# Patient Record
Sex: Male | Born: 1940
Health system: Southern US, Community
[De-identification: ages and names within clinical notes are randomized; demographics above are authoritative.]

## PROBLEM LIST (undated history)

## (undated) DIAGNOSIS — K2950 Unspecified chronic gastritis without bleeding: Secondary | ICD-10-CM

## (undated) DIAGNOSIS — C884 Extranodal marginal zone B-cell lymphoma of mucosa-associated lymphoid tissue [MALT-lymphoma]: Secondary | ICD-10-CM

## (undated) DIAGNOSIS — D649 Anemia, unspecified: Secondary | ICD-10-CM

## (undated) DIAGNOSIS — D509 Iron deficiency anemia, unspecified: Secondary | ICD-10-CM

## (undated) DIAGNOSIS — F17200 Nicotine dependence, unspecified, uncomplicated: Secondary | ICD-10-CM

## (undated) DIAGNOSIS — B9681 Helicobacter pylori [H. pylori] as the cause of diseases classified elsewhere: Secondary | ICD-10-CM

## (undated) DIAGNOSIS — C9310 Chronic myelomonocytic leukemia not having achieved remission: Secondary | ICD-10-CM

## (undated) DIAGNOSIS — D126 Benign neoplasm of colon, unspecified: Secondary | ICD-10-CM

## (undated) HISTORY — DX: Benign neoplasm of colon, unspecified: D12.6

## (undated) HISTORY — DX: Nicotine dependence, unspecified, uncomplicated: F17.200

## (undated) HISTORY — DX: Unspecified chronic gastritis without bleeding: K29.50

## (undated) HISTORY — PX: ESOPHAGOGASTRODUODENOSCOPY ENDOSCOPY: SHX5814

## (undated) HISTORY — DX: Iron deficiency anemia, unspecified: D50.9

## (undated) HISTORY — DX: Helicobacter pylori (H. pylori) as the cause of diseases classified elsewhere: B96.81

## (undated) HISTORY — PX: OTHER SURGICAL HISTORY: SHX169

## (undated) HISTORY — DX: Extranodal marginal zone B-cell lymphoma of mucosa-associated lymphoid tissue (MALT-lymphoma): C88.4

## (undated) HISTORY — DX: Chronic myelomonocytic leukemia not having achieved remission: C93.10

---

## 2011-08-01 ENCOUNTER — Encounter: Payer: Self-pay | Admitting: Family Medicine

## 2011-08-01 ENCOUNTER — Ambulatory Visit (INDEPENDENT_AMBULATORY_CARE_PROVIDER_SITE_OTHER): Payer: Medicare Other | Admitting: Family Medicine

## 2011-08-01 DIAGNOSIS — N433 Hydrocele, unspecified: Secondary | ICD-10-CM

## 2011-08-01 NOTE — Progress Notes (Signed)
  Subjective:    Patient ID: Jonathan Green, male    DOB: 1941/02/24, 70 y.o.   MRN: [id number]  HPI He is here for evaluation of swelling in his scrotum. He states that approximately 8 years ago he was diagnosed with hydrocele and had been seen by urology at that time. He chose to do nothing about it. He notes that the swelling is getting worse and causing more mechanical discomfort.   Review of Systems     Objective:   Physical Exam Exam of the right testes normal. Left testes could not be palpated. Transillumination was positive      Assessment & Plan:   1. Hydrocele, left  Ambulatory referral to Urology

## 2011-08-30 ENCOUNTER — Other Ambulatory Visit: Payer: Self-pay | Admitting: Urology

## 2011-09-20 ENCOUNTER — Encounter (HOSPITAL_BASED_OUTPATIENT_CLINIC_OR_DEPARTMENT_OTHER): Payer: Self-pay | Admitting: *Deleted

## 2011-09-20 NOTE — Progress Notes (Signed)
To [facility] at 0730.NPO after mn.Hg,Ekg on arrival.

## 2011-09-21 NOTE — H&P (Signed)
History of Present Illness     Mr. Jonathan Green is a 71 year old male patient of Dr. Susann Givens seen in office consultation today for further evaluation of a left hydrocele. The patient reports he was diagnosed with a hydrocele by a urologist about 8 years ago and elected to observe it. Over time it has increased in size.  it has become mechanically uncomfortable. It was found to transilluminate consistent with a hydrocele. He reports he has no voiding symptoms. His hydrocele would be of moderate severity with no modifying factors or associated signs and symptoms.   Past Medical History Problems  1. History of  No Medical Problems  Surgical History Problems  1. History of  No Surgical Problems  Current Meds 1. Advil 200 MG Oral Capsule; Therapy: (Recorded:13Dec2012) to  Allergies Medication  1. No Known Drug Allergies  Family History Problems  1. Family history of  No Significant Family History  Social History Problems    Caffeine Use   Marital History - Currently Married   Tobacco Use 305.1 Denied    History of  Alcohol Use  Review of Systems Genitourinary, constitutional, skin, eye, otolaryngeal, hematologic/lymphatic, cardiovascular, pulmonary, endocrine, musculoskeletal, gastrointestinal, neurological and psychiatric system(s) were reviewed and pertinent findings if present are noted.  Genitourinary: urinary frequency, feelings of urinary urgency, nocturia and scrotal swelling.  Musculoskeletal: back pain.    Vitals Vital Signs  BMI Calculated: 28.04 BSA Calculated: 1.98 Height: 5 ft 8 in Weight: 185 lb  Blood Pressure: 123 / 66 Heart Rate: 59  Physical Exam Constitutional: Well nourished and well developed . No acute distress.  ENT:. The ears and nose are normal in appearance.  Neck: The appearance of the neck is normal and no neck mass is present.  Pulmonary: No respiratory distress and normal respiratory rhythm and effort.  Cardiovascular: Heart rate and rhythm  are normal . No peripheral edema.  Abdomen: The abdomen is soft and nontender. No masses are palpated. No CVA tenderness. No hernias are palpable. No hepatosplenomegaly noted.  Genitourinary: Examination of the penis demonstrates no discharge, no masses, no lesions and a normal meatus. The scrotum is without lesions. Examination of the left scrotum demostrates a hydrocele. The right epididymis is palpably normal and non-tender. The left epididymis is palpably normal and non-tender. The right testis is non-tender and without masses. The left testis is non-tender and without masses.  Lymphatics: The femoral and inguinal nodes are not enlarged or tender.  Skin: Normal skin turgor, no visible rash and no visible skin lesions.  Neuro/Psych:. Mood and affect are appropriate.    Results/Data Urine  COLOR: YELLOW  Reference Range YELLOW APPEARANCE: CLEAR  Reference Range CLEAR SPECIFIC GRAVITY: 1.025  Reference Range 1.005-1.030 pH: 5.5  Reference Range 5.0-8.0 GLUCOSE: NEG mg/dL Reference Range NEG BILIRUBIN: NEG  Reference Range NEG KETONE: NEG mg/dL Reference Range NEG BLOOD: MOD  Abnormal Reference Range NEG PROTEIN: NEG mg/dL Reference Range NEG UROBILINOGEN: 0.2 mg/dL Reference Range 1.6-1.0 NITRITE: NEG  Reference Range NEG LEUKOCYTE ESTERASE: NEG  Reference Range NEG SQUAMOUS EPITHELIAL/HPF: NONE SEEN  Reference Range RARE WBC: NONE SEEN WBC/hpf Reference Range <4 RBC: 0-3 RBC/hpf Reference Range <4 BACTERIA: NONE SEEN  Reference Range RARE CRYSTALS: NONE SEEN  Reference Range NEG CASTS: NONE SEEN  Reference Range NEG  Old records or history reviewed: notes from Dr. Susann Givens office as above.    Assessment Assessed  1. Hydrocele Left 603.9    He appears to have a simple left hydrocele that has been increasing  in size and has become increasingly symptomatic. We therefore discussed the treatment options. He does not want to continue observation and therefore we discussed hydrocelectomy.  I went over the procedure with him in detail including its risks, complications, incision used and anticipated postoperative recovery as well as probability of success with surgery. He understands and has elected to proceed with hydrocelectomy.   Plan Health Maintenance (V70.0)  1. UA With REFLEX  Done: 13Dec2012 07:57AM Hydrocele Left (603.9)  2. Follow-up Schedule Surgery Office  Follow-up  Done: 13Dec2012       He will be scheduled for a left hydrocelectomy

## 2011-09-22 ENCOUNTER — Encounter (HOSPITAL_BASED_OUTPATIENT_CLINIC_OR_DEPARTMENT_OTHER): Payer: Self-pay | Admitting: *Deleted

## 2011-09-22 ENCOUNTER — Other Ambulatory Visit: Payer: Self-pay

## 2011-09-22 ENCOUNTER — Ambulatory Visit (HOSPITAL_BASED_OUTPATIENT_CLINIC_OR_DEPARTMENT_OTHER): Payer: Medicare Other | Admitting: Anesthesiology

## 2011-09-22 ENCOUNTER — Encounter (HOSPITAL_BASED_OUTPATIENT_CLINIC_OR_DEPARTMENT_OTHER): Payer: Self-pay | Admitting: Anesthesiology

## 2011-09-22 ENCOUNTER — Encounter (HOSPITAL_BASED_OUTPATIENT_CLINIC_OR_DEPARTMENT_OTHER)
Admission: RE | Disposition: A | Discharge status: Home / Self Care | Payer: Self-pay | Source: Ambulatory Visit | Attending: Urology

## 2011-09-22 ENCOUNTER — Ambulatory Visit (HOSPITAL_BASED_OUTPATIENT_CLINIC_OR_DEPARTMENT_OTHER)
Admission: RE | Admit: 2011-09-22 | Discharge: 2011-09-22 | Disposition: A | Discharge status: Home / Self Care | Payer: Medicare Other | Source: Ambulatory Visit | Attending: Urology | Admitting: Urology

## 2011-09-22 DIAGNOSIS — N433 Hydrocele, unspecified: Secondary | ICD-10-CM | POA: Insufficient documentation

## 2011-09-22 HISTORY — PX: HYDROCELE EXCISION: SHX482

## 2011-09-22 LAB — POCT HEMOGLOBIN-HEMACUE: Hemoglobin: 11.5 g/dL — ABNORMAL LOW (ref 13.0–17.0)

## 2011-09-22 SURGERY — HYDROCELECTOMY
Anesthesia: General | Site: Scrotum | Laterality: Left | Wound class: Clean

## 2011-09-22 MED ORDER — CEFAZOLIN SODIUM 1-5 GM-% IV SOLN: 1.0000 g | INTRAVENOUS | Status: DC

## 2011-09-22 MED ORDER — CEFAZOLIN SODIUM-DEXTROSE 2-3 GM-% IV SOLR: 2.0000 g | INTRAVENOUS | Status: DC

## 2011-09-22 MED ORDER — KETOROLAC TROMETHAMINE 30 MG/ML IJ SOLN
INTRAMUSCULAR | Status: DC | PRN
  Administered 2011-09-22: 30 mg via INTRAVENOUS

## 2011-09-22 MED ORDER — PROPOFOL 10 MG/ML IV EMUL
INTRAVENOUS | Status: DC | PRN
  Administered 2011-09-22: 180 mg via INTRAVENOUS

## 2011-09-22 MED ORDER — LACTATED RINGERS IV SOLN
INTRAVENOUS | Status: DC | PRN
  Administered 2011-09-22 (×2): via INTRAVENOUS

## 2011-09-22 MED ORDER — FENTANYL CITRATE 0.05 MG/ML IJ SOLN: 25.0000 ug | INTRAMUSCULAR | Status: DC | PRN

## 2011-09-22 MED ORDER — OXYCODONE-ACETAMINOPHEN 10-325 MG PO TABS: 1.0000 | ORAL_TABLET | ORAL | Status: AC | PRN

## 2011-09-22 MED ORDER — CEFAZOLIN SODIUM 1-5 GM-% IV SOLN
INTRAVENOUS | Status: DC | PRN
  Administered 2011-09-22: 2 g via INTRAVENOUS

## 2011-09-22 MED ORDER — PROMETHAZINE HCL 25 MG/ML IJ SOLN: 6.2500 mg | INTRAMUSCULAR | Status: DC | PRN

## 2011-09-22 MED ORDER — LACTATED RINGERS IV SOLN
INTRAVENOUS | Status: DC
  Administered 2011-09-22: 100 mL/h via INTRAVENOUS

## 2011-09-22 MED ORDER — LIDOCAINE HCL (CARDIAC) 20 MG/ML IV SOLN
INTRAVENOUS | Status: DC | PRN
  Administered 2011-09-22: 80 mg via INTRAVENOUS

## 2011-09-22 MED ORDER — FENTANYL CITRATE 0.05 MG/ML IJ SOLN
INTRAMUSCULAR | Status: DC | PRN
  Administered 2011-09-22 (×2): 50 ug via INTRAVENOUS

## 2011-09-22 MED ORDER — DEXAMETHASONE SODIUM PHOSPHATE 4 MG/ML IJ SOLN
INTRAMUSCULAR | Status: DC | PRN
  Administered 2011-09-22: 4 mg via INTRAVENOUS

## 2011-09-22 MED ORDER — 0.9 % SODIUM CHLORIDE (POUR BTL) OPTIME
TOPICAL | Status: DC | PRN
  Administered 2011-09-22: 500 mL

## 2011-09-22 MED ORDER — MIDAZOLAM HCL 5 MG/5ML IJ SOLN
INTRAMUSCULAR | Status: DC | PRN
  Administered 2011-09-22: 2 mg via INTRAVENOUS

## 2011-09-22 MED ORDER — BUPIVACAINE HCL (PF) 0.25 % IJ SOLN
INTRAMUSCULAR | Status: DC | PRN
  Administered 2011-09-22: 10 mL

## 2011-09-22 MED ORDER — ONDANSETRON HCL 4 MG/2ML IJ SOLN
INTRAMUSCULAR | Status: DC | PRN
  Administered 2011-09-22: 4 mg via INTRAVENOUS

## 2011-09-22 SURGICAL SUPPLY — 40 items
BANDAGE GAUZE ELAST BULKY 4 IN (GAUZE/BANDAGES/DRESSINGS) ×2 IMPLANT
BLADE SURG 15 STRL LF DISP TIS (BLADE) ×1 IMPLANT
BLADE SURG 15 STRL SS (BLADE) ×1
BLADE SURG ROTATE 9660 (MISCELLANEOUS) IMPLANT
CANISTER SUCTION 1200CC (MISCELLANEOUS) IMPLANT
CANISTER SUCTION 2500CC (MISCELLANEOUS) ×2 IMPLANT
CLEANER CAUTERY TIP 5X5 PAD (MISCELLANEOUS) IMPLANT
CLOTH BEACON ORANGE TIMEOUT ST (SAFETY) ×2 IMPLANT
COVER MAYO STAND STRL (DRAPES) ×2 IMPLANT
COVER TABLE BACK 60X90 (DRAPES) ×2 IMPLANT
DISSECTOR ROUND CHERRY 3/8 STR (MISCELLANEOUS) IMPLANT
DRAPE PED LAPAROTOMY (DRAPES) ×2 IMPLANT
ELECT REM PT RETURN 9FT ADLT (ELECTROSURGICAL)
ELECTRODE REM PT RTRN 9FT ADLT (ELECTROSURGICAL) IMPLANT
GAUZE KERLIX 2  STERILE LF (GAUZE/BANDAGES/DRESSINGS) ×2 IMPLANT
GLOVE BIO SURGEON STRL SZ 6.5 (GLOVE) ×2 IMPLANT
GLOVE BIO SURGEON STRL SZ8 (GLOVE) ×2 IMPLANT
GLOVE ECLIPSE 6.0 STRL STRAW (GLOVE) ×2 IMPLANT
GOWN PREVENTION PLUS LG XLONG (DISPOSABLE) ×2 IMPLANT
GOWN STRL REIN XL XLG (GOWN DISPOSABLE) ×2 IMPLANT
IV NS IRRIG 3000ML ARTHROMATIC (IV SOLUTION) IMPLANT
NEEDLE HYPO 25X1 1.5 SAFETY (NEEDLE) IMPLANT
NS IRRIG 500ML POUR BTL (IV SOLUTION) ×2 IMPLANT
PACK BASIN DAY SURGERY FS (CUSTOM PROCEDURE TRAY) ×2 IMPLANT
PAD CLEANER CAUTERY TIP 5X5 (MISCELLANEOUS)
PENCIL BUTTON HOLSTER BLD 10FT (ELECTRODE) ×2 IMPLANT
SUPPORT SCROTAL LG STRP (MISCELLANEOUS) ×2 IMPLANT
SUPPORT SCROTAL LRG NO STRP (SOFTGOODS) ×2 IMPLANT
SUT CHROMIC 3 0 SH 27 (SUTURE) ×6 IMPLANT
SUT SILK 4 0 TIES 17X18 (SUTURE) IMPLANT
SUT VIC AB 4-0 RB1 27 (SUTURE)
SUT VIC AB 4-0 RB1 27X BRD (SUTURE) IMPLANT
SUT VICRYL 6 0 RB 1 (SUTURE) IMPLANT
SYR CONTROL 10ML LL (SYRINGE) IMPLANT
TOWEL OR 17X24 6PK STRL BLUE (TOWEL DISPOSABLE) ×2 IMPLANT
TRAY DSU PREP LF (CUSTOM PROCEDURE TRAY) ×2 IMPLANT
TUBE CONNECTING 12X1/4 (SUCTIONS) ×2 IMPLANT
WATER STERILE IRR 3000ML UROMA (IV SOLUTION) IMPLANT
WATER STERILE IRR 500ML POUR (IV SOLUTION) ×2 IMPLANT
YANKAUER SUCT BULB TIP NO VENT (SUCTIONS) ×2 IMPLANT

## 2011-09-22 NOTE — Anesthesia Procedure Notes (Signed)
Procedure Name: LMA Insertion Date/Time: 09/22/2011 9:14 AM Performed by: Iline Oven Pre-anesthesia Checklist: Patient identified, Emergency Drugs available, Suction available and Patient being monitored Patient Re-evaluated:Patient Re-evaluated prior to inductionOxygen Delivery Method: Circle System Utilized Preoxygenation: Pre-oxygenation with 100% oxygen Intubation Type: IV induction Ventilation: Mask ventilation without difficulty LMA: LMA with gastric port inserted LMA Size: 4.0 Number of attempts: 1 Placement Confirmation: positive ETCO2 Tube secured with: Tape Dental Injury: Teeth and Oropharynx as per pre-operative assessment

## 2011-09-22 NOTE — Anesthesia Postprocedure Evaluation (Signed)
  Anesthesia Post-op Note  Patient: Jonathan Green  Procedure(s) Performed:  HYDROCELECTOMY ADULT  Patient Location: PACU  Anesthesia Type: General  Level of Consciousness: awake and alert   Airway and Oxygen Therapy: Patient Spontanous Breathing  Post-op Pain: mild  Post-op Assessment: Post-op Vital signs reviewed, Patient's Cardiovascular Status Stable, Respiratory Function Stable, Patent Airway and No signs of Nausea or vomiting  Post-op Vital Signs: stable  Complications: No apparent anesthesia complications

## 2011-09-22 NOTE — Op Note (Signed)
PATIENT:  Jonathan Green  PRE-OPERATIVE DIAGNOSIS:POST-OPERATIVE DIAGNOSIS:  Same  PROCEDURE:  Procedure(s): Left hydrocelectomy  SURGEON:  Garnett Farm  INDICATION: Jonathan Green is a 71 year old male patient who was originally diagnosed with a hydrocele 8 years ago. He elected for conservative management with observation however over time it has increased in size and has become mechanically uncomfortable. We discussed the treatment options and has elected to proceed with surgical therapy.  ANESTHESIA:  General  EBL:  Minimal  DRAINS: None  LOCAL MEDICATIONS USED:  1/4% plain Marcaine 10 cc  SPECIMEN:  None  DISPOSITION OF SPECIMEN:  N/A  Description of procedure: After informed consent the patient was brought to the major OR, placed on the table and administered general anesthesia. His genitalia was then sterilely prepped and draped. An official timeout was then performed.  A midline median raphae scrotal incision was then made and carried down over the left hydrocele. The tissue over the hydrocele was cleared using a combination of sharp and blunt technique. The hydrocele was then opened, drained of clear amber fluid and delivered through the incision. The excess hydrocele sac tissue was excised with the Bovie electrocautery and then I cauterized the edges. I then reapproximated the edges posteriorly behind the epididymis with a running, locking 3-0 chromic suture. The appendix testis was removed with the Bovie.   His testicle was then replaced in the normal anatomic position and his left hemiscrotum. I then closed the deep scrotal tissue with running 3-0 chromic suture in a locking fashion. I injected quarter percent Marcaine in the subcutaneous tissue and closed the skin with running 3-0 chromic. Neosporin, a sterile gauze dressing, fluff Kerlix and a scrotal support were applied. The patient tolerated the procedure well no intraoperative complications. Needle sponge and instrument  counts were correct at the end of the operation.   PLAN OF CARE: Discharge to home after PACU  PATIENT DISPOSITION:  PACU - hemodynamically stable.

## 2011-09-22 NOTE — Anesthesia Preprocedure Evaluation (Signed)
Anesthesia Evaluation  Patient identified by MRN, date of birth, ID band Patient awake    Reviewed: Allergy & Precautions, H&P , NPO status , Patient's Chart, lab work & pertinent test results  Airway Mallampati: II TM Distance: >3 FB Neck ROM: Full    Dental No notable dental hx.    Pulmonary neg pulmonary ROS, Current Smoker,  clear to auscultation  Pulmonary exam normal       Cardiovascular neg cardio ROS Regular Normal    Neuro/Psych Negative Neurological ROS  Negative Psych ROS   GI/Hepatic negative GI ROS, Neg liver ROS,   Endo/Other  Negative Endocrine ROS  Renal/GU negative Renal ROS  Genitourinary negative   Musculoskeletal negative musculoskeletal ROS (+)   Abdominal   Peds negative pediatric ROS (+)  Hematology negative hematology ROS (+)   Anesthesia Other Findings   Reproductive/Obstetrics negative OB ROS                           Anesthesia Physical Anesthesia Plan  ASA: II  Anesthesia Plan: General   Post-op Pain Management:    Induction: Intravenous  Airway Management Planned:   Additional Equipment:   Intra-op Plan:   Post-operative Plan: Extubation in OR  Informed Consent: I have reviewed the patients History and Physical, chart, labs and discussed the procedure including the risks, benefits and alternatives for the proposed anesthesia with the patient or authorized representative who has indicated his/her understanding and acceptance.   Dental advisory given  Plan Discussed with: CRNA  Anesthesia Plan Comments:         Anesthesia Quick Evaluation

## 2011-09-22 NOTE — Transfer of Care (Signed)
Immediate Anesthesia Transfer of Care Note  Patient: Jonathan Green  Procedure(s) Performed:  HYDROCELECTOMY ADULT  Patient Location: PACU  Anesthesia Type: General  Level of Consciousness: awake, sedated, patient cooperative and responds to stimulation  Airway & Oxygen Therapy: Patient Spontanous Breathing and Patient connected to face mask oxygen  Post-op Assessment: Report given to PACU RN, Post -op Vital signs reviewed and stable and Patient moving all extremities  Post vital signs: Reviewed and stable  Complications: No apparent anesthesia complications

## 2011-09-22 NOTE — Anesthesia Postprocedure Evaluation (Signed)
Immediate Anesthesia Transfer of Care Note  Patient: Jonathan Green  Procedure(s) Performed: * No surgery found *  Patient Location: PACU  Anesthesia Type: General  Level of Consciousness: awake, sedated, patient cooperative and responds to stimulation  Airway & Oxygen Therapy: Patient Spontanous Breathing and Patient connected to face mask oxygen  Post-op Assessment: Report given to PACU RN, Post -op Vital signs reviewed and stable and Patient moving all extremities  Post vital signs: Reviewed and stable  Complications: No apparent anesthesia complications

## 2011-09-22 NOTE — Transfer of Care (Deleted)
Immediate Anesthesia Transfer of Care Note  Patient: Jonathan Green  Procedure(s) Performed:  HYDROCELECTOMY ADULT  Patient Location: PACU  Anesthesia Type: General  Level of Consciousness: awake, sedated, patient cooperative and responds to stimulation  Airway & Oxygen Therapy: Patient Spontanous Breathing and Patient connected to face mask oxygen  Post-op Assessment: Report given to PACU RN, Post -op Vital signs reviewed and stable and Patient moving all extremities  Post vital signs: Reviewed and stable  Complications: No apparent anesthesia complications  

## 2011-09-25 ENCOUNTER — Encounter (HOSPITAL_BASED_OUTPATIENT_CLINIC_OR_DEPARTMENT_OTHER): Payer: Self-pay | Admitting: Urology

## 2012-02-28 ENCOUNTER — Encounter: Payer: Self-pay | Admitting: Family Medicine

## 2012-02-28 ENCOUNTER — Ambulatory Visit (INDEPENDENT_AMBULATORY_CARE_PROVIDER_SITE_OTHER): Payer: Medicare Other | Admitting: Family Medicine

## 2012-02-28 VITALS — BP 120/68 | HR 88 | Temp 98.3°F | Ht 68.0 in | Wt 183.0 lb

## 2012-02-28 DIAGNOSIS — J309 Allergic rhinitis, unspecified: Secondary | ICD-10-CM

## 2012-02-28 DIAGNOSIS — R05 Cough: Secondary | ICD-10-CM

## 2012-02-28 MED ORDER — HYDROCOD POLST-CHLORPHEN POLST 10-8 MG/5ML PO LQCR: 5.0000 mL | Freq: Every evening | ORAL | Status: DC | PRN

## 2012-02-28 NOTE — Patient Instructions (Addendum)
Cough--only finding on exam (and backed up some by the history) is that of allergies with postnasal drainage, contributing to the cough being worse at night.  Continue Allegra.  Add Mucinex D (or plain Mucinex and a decongestant such as pseudofed--either the one behind the counter at pharmacy, or the PE on shelf)  Use the Tussionex only at bedtime, causes sedation, which may last into the morning, as medication works for 12 hours. You can use Delsym syrup (cough suppressant) during the day, if needed, and the tussionex at night

## 2012-02-28 NOTE — Progress Notes (Signed)
Chief Complaint  Patient presents with  . Cough    sever cough x 2 days.   HPI:  Started sneezing about 3 days ago, thought related to dust, then 2 days ago started coughing, but got much worse last night.  Cough is dry, worse when lying down.  Wife states she could hear him "wheezing" from down the hall.  Had some chest pain after a big coughing spell.  Denies any relation to food or exertion.  Denies fevers or sick contacts.  Started taking Allegra yesterday, Nyquil x 3 days without any improvement.  History reviewed. No pertinent past medical history. Past Surgical History  Procedure Date  . Hydrocele excision 09/22/2011    Procedure: HYDROCELECTOMY ADULT;  Surgeon: Garnett Farm, MD;  Location[facility];  Service: Urology;  Laterality: Left;   History   Social History  . Marital Status: Married    Spouse Name: N/A    Number of Children: N/A  . Years of Education: N/A   Occupational History  . retired Conservation officer, historic buildings)    Social History Main Topics  . Smoking status: Current Some Day Smoker    Types: Cigars  . Smokeless tobacco: Never Used  . Alcohol Use: No  . Drug Use: No  . Sexually Active: Not on file   Other Topics Concern  . Not on file   Social History Narrative  . No narrative on file   Current Outpatient Prescriptions on File Prior to Visit  Medication Sig Dispense Refill  . fexofenadine (ALLEGRA) 180 MG tablet Take 180 mg by mouth daily.       No Known Allergies  ROS:  Denies fevers, nausea, vomiting, diarrhea, skin rashes.  No exertional chest pain, leg swelling. No bleeding/bruising, joint pains/myalgias  PHYSICAL EXAM: BP 120/68  Pulse 88  Temp 98.3 F (36.8 C) (Oral)  Ht 5\' 8"  (1.727 m)  Wt 183 lb (83.008 kg)  BMI 27.82 kg/m2 Well developed male with only very rare dry cough.  In no distress HEENT:  PERRL, EOMI, conjunctiva clear.  TM's and EAC's normal.  OP clear.  Nasal mucosa mildly edematous, especially on left, with clear  mucus.  Sinuses nontender Neck: no lymphadenopathy or mass Heart: regular rate and rhythm Lungs: clear bilaterally, with good air movement,  No wheezes, rales or ronchi Skin: no rash Psych: normal mood  ASSESSMENT/PLAN:  1. Cough  chlorpheniramine-HYDROcodone (TUSSIONEX PENNKINETIC ER) 10-8 MG/5ML LQCR  2. Allergic rhinitis, cause unspecified      Cough--only finding on exam (and backed up some by the history) is that of allergies with postnasal drainage, contributing to the cough being worse at night.  Continue Allegra.  Add Mucinex D (or plain Mucinex and a decongestant such as pseudofed--either the one behind the counter at pharmacy, or the PE on shelf)  Use the Tussionex only at bedtime, causes sedation, which may last into the morning, as medication works for 12 hours.  Risks/side effects reviewed.

## 2012-03-05 ENCOUNTER — Encounter: Payer: Self-pay | Admitting: Family Medicine

## 2012-03-05 ENCOUNTER — Ambulatory Visit (INDEPENDENT_AMBULATORY_CARE_PROVIDER_SITE_OTHER): Payer: Medicare Other | Admitting: Family Medicine

## 2012-03-05 VITALS — BP 116/70 | HR 71 | Wt 183.0 lb

## 2012-03-05 DIAGNOSIS — J209 Acute bronchitis, unspecified: Secondary | ICD-10-CM

## 2012-03-05 MED ORDER — AMOXICILLIN 875 MG PO TABS: 875.0000 mg | ORAL_TABLET | Freq: Two times a day (BID) | ORAL | Status: AC

## 2012-03-05 MED ORDER — BENZONATATE 200 MG PO CAPS: 200.0000 mg | ORAL_CAPSULE | Freq: Three times a day (TID) | ORAL | Status: AC | PRN

## 2012-03-05 NOTE — Patient Instructions (Signed)
Take the antibiotic and a cough suppressant and call me if you're not totally back to normal when you finish the antibiotic

## 2012-03-05 NOTE — Progress Notes (Signed)
  Subjective:    Patient ID: Jonathan Green, male    DOB: [birth year], 71 y.o.   MRN: [id number]  HPI He has a two-week history that started with coughing but no fever, chills, sore throat. No earache, sore throat, chest pain, shortness of breath .This has continued and he continues to wheeze. He smokes cigars and at this time is not ready to quit   Review of Systems     Objective:   Physical Exam alert and in no distress. Tympanic membranes and canals are normal. Throat is clear. Tonsils are normal. Neck is supple without adenopathy or thyromegaly. Cardiac exam shows a regular sinus rhythm without murmurs or gallops. Lungs old bilateral lower lung rhonchi The note from Dr. Lynelle Doctor was reviewed.       Assessment & Plan:   1. Bronchitis, acute  amoxicillin (AMOXIL) 875 MG tablet, benzonatate (TESSALON) 200 MG capsule   recommend he call me when he finishes the antibiotic and me know how he is doing.

## 2012-10-07 ENCOUNTER — Ambulatory Visit (INDEPENDENT_AMBULATORY_CARE_PROVIDER_SITE_OTHER): Payer: Medicare Other | Admitting: Family Medicine

## 2012-10-07 ENCOUNTER — Encounter: Payer: Self-pay | Admitting: Family Medicine

## 2012-10-07 VITALS — BP 124/70 | HR 76 | Wt 180.0 lb

## 2012-10-07 DIAGNOSIS — B029 Zoster without complications: Secondary | ICD-10-CM

## 2012-10-07 MED ORDER — TRAMADOL HCL 50 MG PO TABS: 50.0000 mg | ORAL_TABLET | Freq: Three times a day (TID) | ORAL | Status: DC | PRN

## 2012-10-07 MED ORDER — VALACYCLOVIR HCL 1 G PO TABS: 1000.0000 mg | ORAL_TABLET | Freq: Three times a day (TID) | ORAL | Status: DC

## 2012-10-07 NOTE — Patient Instructions (Addendum)
Shingles Shingles is caused by the same virus that causes chickenpox (varicella zoster virus or VZV). Shingles often occurs many years or decades after having chickenpox. That is why it is more common in adults older than 50 years. The virus reactivates and breaks out as an infection in a nerve root. SYMPTOMS   The initial feeling (sensations) may be pain. This pain is usually described as:  Burning.  Stabbing.  Throbbing.  Tingling in the nerve root.  A red rash will follow in a couple days. The rash may occur in any area of the body and is usually on one side (unilateral) of the body in a band or belt-like pattern. The rash usually starts out as very small blisters (vesicles). They will dry up after 7 to 10 days. This is not usually a significant problem except for the pain it causes.  Long-lasting (chronic) pain is more likely in an elderly person. It can last months to years. This condition is called postherpetic neuralgia. Shingles can be an extremely severe infection in someone with AIDS, a weakened immune system, or with forms of leukemia. It can also be severe if you are taking transplant medicines or other medicines that weaken the immune system. TREATMENT  Your caregiver will often treat you with:  Antiviral drugs.  Anti-inflammatory drugs.  Pain medicines. Bed rest is very important in preventing the pain associated with herpes zoster (postherpetic neuralgia). Application of heat in the form of a hot water bottle or electric heating pad or gentle pressure with the hand is recommended to help with the pain or discomfort. PREVENTION  A varicella zoster vaccine is available to help protect against the virus. The Food and Drug Administration approved the varicella zoster vaccine for individuals 86 years of age and older. HOME CARE INSTRUCTIONS   Cool compresses to the area of rash may be helpful.  Only take over-the-counter or prescription medicines for pain, discomfort, or  fever as directed by your caregiver.  Avoid contact with:  Babies.  Pregnant women.  Children with eczema.  Elderly people with transplants.  People with chronic illnesses, such as leukemia and AIDS.  If the area involved is on your face, you may receive a referral for follow-up to a specialist. It is very important to keep all follow-up appointments. This will help avoid eye complications, chronic pain, or disability. SEEK IMMEDIATE MEDICAL CARE IF:   You develop any pain (headache) in the area of the face or eye. This must be followed carefully by your caregiver or ophthalmologist. An infection in part of your eye (cornea) can be very serious. It could lead to blindness.  You do not have pain relief from prescribed medicines.  Your redness or swelling spreads.  The area involved becomes very swollen and painful.  You have a fever.  You notice any red or painful lines extending away from the affected area toward your heart (lymphangitis).  Your condition is worsening or has changed. Document Released: 08/21/2005 Document Revised: 11/13/2011 Document Reviewed: 07/26/2009 [facility] Patient Information 2013 [place], [place]. Take the Valtrex and the tramadol ;the tramadol as as needed. If the tramadol doesn't work let me know. Stay away from pregnant women and little children

## 2012-10-07 NOTE — Progress Notes (Signed)
  Subjective:    Patient ID: Jonathan Green, male    DOB: 24-May-1941, 72 y.o.   MRN: [id number]  HPI Approximately 5 days ago he started having right-sided neck pain radiated into his back that last for several seconds then goes away for several seconds and then comes back again   Review of Systems     Objective:   Physical Exam Alert and in no distress. Erythematous lesions are noted in the right posterior occipital area however they are not vesicular but are definitely in a dermatome pattern.       Assessment & Plan:   1. Shingles  valACYclovir (VALTREX) 1000 MG tablet, traMADol (ULTRAM) 50 MG tablet   I explained the diagnosis of shingles in terms of the etiology and the best treatment. Discussed appropriate followup and also discussed PHN. He will call if further difficulty.

## 2012-10-08 ENCOUNTER — Telehealth: Payer: Self-pay

## 2012-10-08 MED ORDER — ONDANSETRON HCL 4 MG PO TABS: 4.0000 mg | ORAL_TABLET | Freq: Three times a day (TID) | ORAL | Status: DC | PRN

## 2012-10-08 NOTE — Telephone Encounter (Signed)
Zofran called in due to vomiting from pain med

## 2012-10-08 NOTE — Addendum Note (Signed)
Addended by: Ronnald Nian on: 10/08/2012 08:42 AM   Modules accepted: Orders

## 2012-10-08 NOTE — Telephone Encounter (Signed)
Called and talked with wife Corrie Dandy and let her know Zofran was called in per [place]

## 2012-10-08 NOTE — Telephone Encounter (Signed)
Zofran called in for vomiting due to the pain med.

## 2012-10-08 NOTE — Telephone Encounter (Signed)
Pt states tramadol is causing him to vomit can he get some zofran or something to take with the tramadol so he take the pain med he has always had to have something to take to keep from vomiting when taking pain med please advise

## 2012-10-11 ENCOUNTER — Telehealth: Payer: Self-pay

## 2012-10-11 MED ORDER — ONDANSETRON HCL 8 MG PO TABS: 8.0000 mg | ORAL_TABLET | Freq: Three times a day (TID) | ORAL | Status: DC | PRN

## 2012-10-11 NOTE — Telephone Encounter (Signed)
CALLED IN ZOFRAN 8 MG PER [facility]

## 2012-10-11 NOTE — Telephone Encounter (Signed)
WIFE CALLED AND LEFT MESSAGE THAT PT CAN NOT TAKE TRAMADOL THAT EVEN WITH THE ZOFRAN PT STILL VOMITS PLEASE ADVISE

## 2012-10-11 NOTE — Telephone Encounter (Signed)
Double the Zofran. He can call and did double strength . Presently he is on for so call in 8 mg

## 2012-12-09 ENCOUNTER — Encounter: Payer: Self-pay | Admitting: Family Medicine

## 2012-12-09 ENCOUNTER — Ambulatory Visit (INDEPENDENT_AMBULATORY_CARE_PROVIDER_SITE_OTHER): Payer: Medicare Other | Admitting: Family Medicine

## 2012-12-09 VITALS — BP 110/68 | HR 64 | Wt 176.0 lb

## 2012-12-09 DIAGNOSIS — K219 Gastro-esophageal reflux disease without esophagitis: Secondary | ICD-10-CM

## 2012-12-09 NOTE — Patient Instructions (Signed)
Take 2 Prilosec right before you eat dinner regularly for the next week and let me know how it does with coughing

## 2012-12-09 NOTE — Progress Notes (Signed)
  Subjective:    Patient ID: Jonathan Green, male    DOB: 08-Dec-1940, 72 y.o.   MRN: [id number]  HPI He developed the onset of cough but no associated fever, chills, sore throat or earache. He notices this especially at night. He states it feels as if something is rushing up. He cannot relate this to any particular foods.He has tried various over-the-counter medications.   Review of Systems     Objective:   Physical Exam alert and in no distress. Tympanic membranes and canals are normal. Throat is clear. Tonsils are normal. Neck is supple without adenopathy or thyromegaly. Cardiac exam shows a regular sinus rhythm without murmurs or gallops. Lungs are clear to auscultation. Abdominal exam shows no masses or tenderness with normal bowel sounds       Assessment & Plan:  GERD (gastroesophageal reflux disease) I recommended that he try to Prilosec prior to his evening meal for the next week and then call me. I explained that I thought his symptoms were reflux related.

## 2014-09-14 DIAGNOSIS — R0602 Shortness of breath: Secondary | ICD-10-CM | POA: Diagnosis not present

## 2014-09-16 ENCOUNTER — Encounter: Payer: Self-pay | Admitting: Family Medicine

## 2014-09-16 ENCOUNTER — Telehealth: Payer: Self-pay

## 2014-09-16 ENCOUNTER — Ambulatory Visit (INDEPENDENT_AMBULATORY_CARE_PROVIDER_SITE_OTHER): Payer: Commercial Managed Care - HMO | Admitting: Family Medicine

## 2014-09-16 VITALS — BP 132/68 | HR 60

## 2014-09-16 DIAGNOSIS — J209 Acute bronchitis, unspecified: Secondary | ICD-10-CM

## 2014-09-16 NOTE — Telephone Encounter (Signed)
Jonathan Green called and said her and Jonathan Green [facility] [place] [phone] the nurse from there called and said that [facility] lab came back that he may possibly have a blood clot in his lung we are waiting on getting info from [facility] .I have talked with Dr.Lalonde he wanted to see labs and we will go from there.

## 2014-09-16 NOTE — Progress Notes (Signed)
   Subjective:    Patient ID: Jonathan Green, male    DOB: [birth year], 74 y.o.   MRN: [id number]  HPI He was seen recently in an urgent care center in [facility] and treated for a 2 week history of difficulty with cough and congestion. That medical record was reviewed however the exam was not part of the paperwork. The x-ray was negative and blood work did have an elevated d-dimer He was placed on cefdinir as well as Tussionex and given Tessalon. He apparently did complain of shortness of breath however further questioning indicates the shortness of breath only occurred with coughing. He has had no leg pain, swelling, recent history of travel or injury to his lower extremities.   Review of Systems     Objective:   Physical Exam alert and in no distress. Tympanic membranes and canals are normal. Throat is clear. Tonsils are normal. Neck is supple without adenopathy or thyromegaly. Cardiac exam shows a regular sinus rhythm without murmurs or gallops. Lungs show scattered rhonchi. Lower extremities show no swelling warmth or tenderness. Homans sign is negative.        Assessment & Plan:  Acute bronchitis, unspecified organism  encouraged him to continue on the antibiotic and call me when he finishes if not entirely better. Recommended he use a cough suppressants as needed especially Tussionex mainly at night. I explained that I did not think he had any issues with blood cot especially since his shortness of breath is only at night. The D-dimer test is probably more of an indication of his age than true indicator of underlying pathology.

## 2014-09-16 NOTE — Telephone Encounter (Signed)
Pt is coming in to be examined

## 2014-09-16 NOTE — Patient Instructions (Addendum)
Use the liquid cough medicine only at nightTake the antibiotic and if you not totally back to normal when you finish call me. If you get a lot of nausea use Emetrol

## 2014-11-03 ENCOUNTER — Encounter: Payer: Self-pay | Admitting: Family Medicine

## 2015-02-22 ENCOUNTER — Encounter: Payer: Self-pay | Admitting: Family Medicine

## 2015-02-22 ENCOUNTER — Ambulatory Visit (INDEPENDENT_AMBULATORY_CARE_PROVIDER_SITE_OTHER): Payer: Commercial Managed Care - HMO | Admitting: Family Medicine

## 2015-02-22 VITALS — BP 122/76 | HR 56 | Ht 67.0 in | Wt 165.0 lb

## 2015-02-22 DIAGNOSIS — Z23 Encounter for immunization: Secondary | ICD-10-CM

## 2015-02-22 DIAGNOSIS — Z720 Tobacco use: Secondary | ICD-10-CM

## 2015-02-22 DIAGNOSIS — F959 Tic disorder, unspecified: Secondary | ICD-10-CM | POA: Diagnosis not present

## 2015-02-22 DIAGNOSIS — F17200 Nicotine dependence, unspecified, uncomplicated: Secondary | ICD-10-CM

## 2015-02-22 LAB — CBC WITH DIFFERENTIAL/PLATELET
BASOS ABS: 0.1 10*3/uL (ref 0.0–0.1)
BASOS PCT: 2 % — AB (ref 0–1)
EOS ABS: 0 10*3/uL (ref 0.0–0.7)
EOS PCT: 0 % (ref 0–5)
HEMATOCRIT: 29 % — AB (ref 39.0–52.0)
HEMOGLOBIN: 9.8 g/dL — AB (ref 13.0–17.0)
LYMPHS PCT: 31 % (ref 12–46)
Lymphs Abs: 2.2 10*3/uL (ref 0.7–4.0)
MCH: 29.3 pg (ref 26.0–34.0)
MCHC: 33.8 g/dL (ref 30.0–36.0)
MCV: 86.6 fL (ref 78.0–100.0)
MPV: 11.2 fL (ref 8.6–12.4)
Monocytes Absolute: 2 10*3/uL — ABNORMAL HIGH (ref 0.1–1.0)
Monocytes Relative: 29 % — ABNORMAL HIGH (ref 3–12)
Neutro Abs: 2.7 10*3/uL (ref 1.7–7.7)
Neutrophils Relative %: 38 % — ABNORMAL LOW (ref 43–77)
PLATELETS: 131 10*3/uL — AB (ref 150–400)
RBC: 3.35 MIL/uL — ABNORMAL LOW (ref 4.22–5.81)
RDW: 15.3 % (ref 11.5–15.5)
WBC: 7 10*3/uL (ref 4.0–10.5)

## 2015-02-22 LAB — COMPREHENSIVE METABOLIC PANEL
ALK PHOS: 51 U/L (ref 39–117)
ALT: 8 U/L (ref 0–53)
AST: 14 U/L (ref 0–37)
Albumin: 3.8 g/dL (ref 3.5–5.2)
BILIRUBIN TOTAL: 0.5 mg/dL (ref 0.2–1.2)
BUN: 17 mg/dL (ref 6–23)
CO2: 26 mEq/L (ref 19–32)
Calcium: 8.4 mg/dL (ref 8.4–10.5)
Chloride: 104 mEq/L (ref 96–112)
Creat: 1.03 mg/dL (ref 0.50–1.35)
Glucose, Bld: 86 mg/dL (ref 70–99)
Potassium: 4.5 mEq/L (ref 3.5–5.3)
SODIUM: 141 meq/L (ref 135–145)
TOTAL PROTEIN: 6.2 g/dL (ref 6.0–8.3)

## 2015-02-22 NOTE — Progress Notes (Signed)
   Subjective:    Patient ID: Jonathan Green, male    DOB: Jan 09, 1941, 74 y.o.   MRN: [id number]  HPI He is here for an interval evaluation. He continues to have difficulty with twitching mainly around the right eye and just distal to this. It does tend to get worse with stress  It has been there for some were between 3 and 5 years. He notes no twitching anywhere else. He states that it does tend to come and go. He then also complained of right shoulder pain with physical activity but states at this point he is having no difficulty with pain or weakness.His immunizations were reviewed. He is also not had a colonoscopy but is not interested in having one. He does smoke cigars and has no plans on quitting. He has had no chest pain, shortness of breath or GI complaints   Review of Systems  All other systems reviewed and are negative.      Objective:   Physical Exam Alert and in no distress. Slight twitching is noted of the right eyelid and cheek area.Tympanic membranes and canals are normal. Pharyngeal area is normal. Neck is supple without adenopathy or thyromegaly. Cardiac exam shows a regular sinus rhythm without murmurs or gallops. Lungs are clear to auscultation. Advanced directive was discussed with him. Information given.       Assessment & Plan:  Facial tic - Plan: CBC with Differential/Platelet, Comprehensive metabolic panel  Need for prophylactic vaccination against Streptococcus pneumoniae (pneumococcus) - Plan: Pneumococcal conjugate vaccine 13-valent  Current smoker on some days Prescription written for him to get TDaP through the pharmacy. Also discussed smoking with him but he is not interested in quitting. We also discussed getting a colonoscopy and again he is not interested.

## 2015-02-24 ENCOUNTER — Telehealth: Payer: Self-pay | Admitting: Family Medicine

## 2015-02-24 NOTE — Telephone Encounter (Signed)
Pt wife called and states needs GI appt and [employer] does not cover any [facility] physicians.  Please call pt late this evening or tomorrow at [phone].  Takai needs appt asap per pt.

## 2015-02-25 NOTE — Telephone Encounter (Signed)
This has been done Dr.Hung 03/01/15/@9 :45 faxed all info over and wife has been informed Carola Rhine referral done

## 2015-03-01 DIAGNOSIS — Z1211 Encounter for screening for malignant neoplasm of colon: Secondary | ICD-10-CM | POA: Diagnosis not present

## 2015-03-01 DIAGNOSIS — D500 Iron deficiency anemia secondary to blood loss (chronic): Secondary | ICD-10-CM | POA: Diagnosis not present

## 2015-03-01 DIAGNOSIS — K5900 Constipation, unspecified: Secondary | ICD-10-CM | POA: Diagnosis not present

## 2015-03-05 ENCOUNTER — Encounter: Payer: Self-pay | Admitting: Family Medicine

## 2015-03-09 DIAGNOSIS — K6389 Other specified diseases of intestine: Secondary | ICD-10-CM | POA: Diagnosis not present

## 2015-03-09 DIAGNOSIS — D509 Iron deficiency anemia, unspecified: Secondary | ICD-10-CM | POA: Diagnosis not present

## 2015-03-09 DIAGNOSIS — B9681 Helicobacter pylori [H. pylori] as the cause of diseases classified elsewhere: Secondary | ICD-10-CM | POA: Diagnosis not present

## 2015-03-09 DIAGNOSIS — D122 Benign neoplasm of ascending colon: Secondary | ICD-10-CM | POA: Diagnosis not present

## 2015-03-09 DIAGNOSIS — K2950 Unspecified chronic gastritis without bleeding: Secondary | ICD-10-CM | POA: Diagnosis not present

## 2015-03-09 DIAGNOSIS — K5520 Angiodysplasia of colon without hemorrhage: Secondary | ICD-10-CM | POA: Diagnosis not present

## 2015-03-09 DIAGNOSIS — D500 Iron deficiency anemia secondary to blood loss (chronic): Secondary | ICD-10-CM | POA: Diagnosis not present

## 2015-03-09 DIAGNOSIS — K2970 Gastritis, unspecified, without bleeding: Secondary | ICD-10-CM | POA: Diagnosis not present

## 2015-03-09 DIAGNOSIS — C884 Extranodal marginal zone B-cell lymphoma of mucosa-associated lymphoid tissue [MALT-lymphoma]: Secondary | ICD-10-CM | POA: Diagnosis not present

## 2015-03-09 DIAGNOSIS — K635 Polyp of colon: Secondary | ICD-10-CM | POA: Diagnosis not present

## 2015-03-17 DIAGNOSIS — K2950 Unspecified chronic gastritis without bleeding: Secondary | ICD-10-CM

## 2015-03-17 DIAGNOSIS — B9681 Helicobacter pylori [H. pylori] as the cause of diseases classified elsewhere: Secondary | ICD-10-CM

## 2015-03-17 DIAGNOSIS — C884 Extranodal marginal zone b-cell lymphoma of mucosa-associated lymphoid tissue (malt-lymphoma) not having achieved remission: Secondary | ICD-10-CM

## 2015-03-17 DIAGNOSIS — D126 Benign neoplasm of colon, unspecified: Secondary | ICD-10-CM

## 2015-03-17 HISTORY — DX: Unspecified chronic gastritis without bleeding: K29.50

## 2015-03-17 HISTORY — DX: Extranodal marginal zone b-cell lymphoma of mucosa-associated lymphoid tissue (malt-lymphoma) not having achieved remission: C88.40

## 2015-03-17 HISTORY — DX: Benign neoplasm of colon, unspecified: D12.6

## 2015-03-17 HISTORY — DX: Extranodal marginal zone B-cell lymphoma of mucosa-associated lymphoid tissue (MALT-lymphoma): C88.4

## 2015-03-17 HISTORY — DX: Helicobacter pylori (H. pylori) as the cause of diseases classified elsewhere: B96.81

## 2015-03-18 ENCOUNTER — Encounter: Payer: Self-pay | Admitting: Internal Medicine

## 2015-03-22 ENCOUNTER — Encounter: Payer: Self-pay | Admitting: Family Medicine

## 2015-04-14 DIAGNOSIS — B9681 Helicobacter pylori [H. pylori] as the cause of diseases classified elsewhere: Secondary | ICD-10-CM | POA: Diagnosis not present

## 2015-04-28 DIAGNOSIS — B9681 Helicobacter pylori [H. pylori] as the cause of diseases classified elsewhere: Secondary | ICD-10-CM | POA: Diagnosis not present

## 2015-05-14 ENCOUNTER — Encounter: Payer: Self-pay | Admitting: Family Medicine

## 2015-05-14 ENCOUNTER — Other Ambulatory Visit: Payer: Self-pay | Admitting: Gastroenterology

## 2015-05-18 ENCOUNTER — Encounter (HOSPITAL_COMMUNITY): Payer: Self-pay | Admitting: *Deleted

## 2015-05-20 NOTE — Anesthesia Preprocedure Evaluation (Addendum)
Anesthesia Evaluation  Patient identified by MRN, date of birth, ID band Patient awake    Reviewed: Allergy & Precautions, NPO status , Patient's Chart, lab work & pertinent test results  History of Anesthesia Complications Negative for: history of anesthetic complications  Airway Mallampati: II  TM Distance: >3 FB Neck ROM: Full    Dental no notable dental hx. (+) Partial Upper, Dental Advisory Given   Pulmonary Current Smoker,    Pulmonary exam normal breath sounds clear to auscultation       Cardiovascular negative cardio ROS Normal cardiovascular exam Rhythm:Regular Rate:Normal     Neuro/Psych negative neurological ROS  negative psych ROS   GI/Hepatic negative GI ROS, Neg liver ROS,   Endo/Other  negative endocrine ROS  Renal/GU negative Renal ROS  negative genitourinary   Musculoskeletal negative musculoskeletal ROS (+)   Abdominal   Peds negative pediatric ROS (+)  Hematology  (+) anemia ,   Anesthesia Other Findings   Reproductive/Obstetrics negative OB ROS                           Anesthesia Physical Anesthesia Plan  ASA: II  Anesthesia Plan: MAC   Post-op Pain Management:    Induction: Intravenous  Airway Management Planned: Nasal Cannula  Additional Equipment:   Intra-op Plan:   Post-operative Plan:   Informed Consent: I have reviewed the patients History and Physical, chart, labs and discussed the procedure including the risks, benefits and alternatives for the proposed anesthesia with the patient or authorized representative who has indicated his/her understanding and acceptance.   Dental advisory given  Plan Discussed with:   Anesthesia Plan Comments:        Anesthesia Quick Evaluation

## 2015-05-20 NOTE — H&P (Signed)
  Jonathan Green HPI: The patient was identified to have a Marginal Zone B-cell Lymphoma in the setting of H. Pylori.  He has cleared the infection, which was confirmed with the post treatment breath test.  He is here for evaluation of his lymphoma.  Past Medical History  Diagnosis Date  . Helicobacter pylori gastritis (chronic gastritis) 03/17/15  . Adenomatous polyp of colon 03/17/15  . 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 Anemia     Past Surgical History  Procedure Laterality Date  . Hydrocele excision  09/22/2011    Procedure: HYDROCELECTOMY ADULT;  Surgeon: Claybon Jabs, MD;  Location: [facility];  Service: Urology;  Laterality: Left;  . Esophagogastroduodenoscopy endoscopy      prior- Dr. Ulyses Amor office    History reviewed. No pertinent family history.  Social History:  reports that he has been smoking Cigars.  He has never used smokeless tobacco. He reports that he does not drink alcohol or use illicit drugs.  Allergies:  Allergies  Allergen Reactions  . Codeine Nausea And Vomiting    Medications: Scheduled: Continuous:  No results found for this or any previous visit (from the past 24 hour(s)).   No results found.  ROS:  As stated above in the HPI otherwise negative.  There were no vitals taken for this visit.    PE: Gen: NAD, Alert and Oriented HEENT:  [place]/AT, EOMI Neck: Supple, no LAD Lungs: CTA Bilaterally CV: RRR without M/G/R ABM: Soft, NTND, +BS Ext: No C/C/E  Assessment/Plan: 1) Marginal Zone B-cell lymphoma - EUS to stage or evaluate for resolution.  Biopsies will be obtained.  Destony Prevost D 05/20/2015, 11:02 AM

## 2015-05-21 ENCOUNTER — Encounter (HOSPITAL_COMMUNITY)
Admission: RE | Disposition: A | Discharge status: Home / Self Care | Payer: Self-pay | Source: Ambulatory Visit | Attending: Gastroenterology

## 2015-05-21 ENCOUNTER — Ambulatory Visit (HOSPITAL_COMMUNITY): Payer: Commercial Managed Care - HMO | Admitting: Anesthesiology

## 2015-05-21 ENCOUNTER — Encounter (HOSPITAL_COMMUNITY): Payer: Self-pay | Admitting: *Deleted

## 2015-05-21 ENCOUNTER — Ambulatory Visit (HOSPITAL_COMMUNITY)
Admission: RE | Admit: 2015-05-21 | Discharge: 2015-05-21 | Disposition: A | Discharge status: Home / Self Care | Payer: Commercial Managed Care - HMO | Source: Ambulatory Visit | Attending: Gastroenterology | Admitting: Gastroenterology

## 2015-05-21 DIAGNOSIS — F17290 Nicotine dependence, other tobacco product, uncomplicated: Secondary | ICD-10-CM | POA: Insufficient documentation

## 2015-05-21 DIAGNOSIS — C8389 Other non-follicular lymphoma, extranodal and solid organ sites: Secondary | ICD-10-CM | POA: Diagnosis not present

## 2015-05-21 DIAGNOSIS — B9681 Helicobacter pylori [H. pylori] as the cause of diseases classified elsewhere: Secondary | ICD-10-CM | POA: Diagnosis not present

## 2015-05-21 DIAGNOSIS — C884 Extranodal marginal zone B-cell lymphoma of mucosa-associated lymphoid tissue [MALT-lymphoma]: Secondary | ICD-10-CM | POA: Diagnosis not present

## 2015-05-21 DIAGNOSIS — K2950 Unspecified chronic gastritis without bleeding: Secondary | ICD-10-CM | POA: Diagnosis not present

## 2015-05-21 DIAGNOSIS — C8519 Unspecified B-cell lymphoma, extranodal and solid organ sites: Secondary | ICD-10-CM | POA: Diagnosis not present

## 2015-05-21 DIAGNOSIS — C8510 Unspecified B-cell lymphoma, unspecified site: Secondary | ICD-10-CM | POA: Diagnosis present

## 2015-05-21 HISTORY — PX: EUS: SHX5427

## 2015-05-21 HISTORY — DX: Anemia, unspecified: D64.9

## 2015-05-21 SURGERY — ULTRASOUND, UPPER GI TRACT, ENDOSCOPIC
Anesthesia: Monitor Anesthesia Care

## 2015-05-21 MED ORDER — SODIUM CHLORIDE 0.9 % IV SOLN: INTRAVENOUS | Status: DC

## 2015-05-21 MED ORDER — PROPOFOL 10 MG/ML IV BOLUS
INTRAVENOUS | Status: DC | PRN
  Administered 2015-05-21: 30 mg via INTRAVENOUS
  Administered 2015-05-21: 40 mg via INTRAVENOUS

## 2015-05-21 MED ORDER — GLYCOPYRROLATE 0.2 MG/ML IJ SOLN
INTRAMUSCULAR | Status: AC
  Filled 2015-05-21: qty 1

## 2015-05-21 MED ORDER — PROPOFOL 10 MG/ML IV BOLUS
INTRAVENOUS | Status: DC | PRN
  Administered 2015-05-21: 200 ug/kg/min via INTRAVENOUS

## 2015-05-21 MED ORDER — LACTATED RINGERS IV SOLN
INTRAVENOUS | Status: DC
  Administered 2015-05-21: 1000 mL via INTRAVENOUS

## 2015-05-21 MED ORDER — GLYCOPYRROLATE 0.2 MG/ML IJ SOLN
INTRAMUSCULAR | Status: DC | PRN
  Administered 2015-05-21: .2 mg via INTRAVENOUS

## 2015-05-21 MED ORDER — LIDOCAINE HCL (CARDIAC) 20 MG/ML IV SOLN
INTRAVENOUS | Status: DC | PRN
  Administered 2015-05-21: 75 mg via INTRAVENOUS

## 2015-05-21 MED ORDER — LIDOCAINE HCL (CARDIAC) 20 MG/ML IV SOLN
INTRAVENOUS | Status: AC
  Filled 2015-05-21: qty 5

## 2015-05-21 MED ORDER — PROPOFOL 10 MG/ML IV BOLUS
INTRAVENOUS | Status: AC
Start: 2015-05-21 — End: 2015-05-21
  Filled 2015-05-21: qty 20

## 2015-05-21 MED ORDER — PROPOFOL 10 MG/ML IV BOLUS
INTRAVENOUS | Status: AC
  Filled 2015-05-21: qty 20

## 2015-05-21 NOTE — Discharge Instructions (Signed)
Esophagogastroduodenoscopy °Care After °Refer to this sheet in the next few weeks. These instructions provide you with information on caring for yourself after your procedure. Your caregiver may also give you more specific instructions. Your treatment has been planned according to current medical practices, but problems sometimes occur. Call your caregiver if you have any problems or questions after your procedure.  °HOME CARE INSTRUCTIONS °· Do not eat or drink anything until the numbing medicine (local anesthetic) has worn off and your gag reflex has returned. You will know that the local anesthetic has worn off when you can swallow comfortably. °· Do not drive for 12 hours after the procedure or as directed by your caregiver. °· Only take medicines as directed by your caregiver. °SEEK MEDICAL CARE IF:  °· You cannot stop coughing. °· You are not urinating at all or less than usual. °SEEK IMMEDIATE MEDICAL CARE IF: °· You have difficulty swallowing. °· You cannot eat or drink. °· You have worsening throat or chest pain. °· You have dizziness, lightheadedness, or you faint. °· You have nausea or vomiting. °· You have chills. °· You have a fever. °· You have severe abdominal pain. °· You have black, tarry, or bloody stools. °Document Released: 08/07/2012 Document Reviewed: 08/07/2012 °ExitCare® Patient Information ©2015 [employer]. This information is not intended to replace advice given to you by your health care provider. Make sure you discuss any questions you have with your health care provider. ° °

## 2015-05-21 NOTE — Op Note (Signed)
[facility], [id number]   UPPER ENDOSCOPIC ULTRASOUND PROCEDURE REPORT     EXAM DATE: 05/21/2015  PATIENT NAME:          Jonathan Green, Jonathan Green          MR#:        [id number]  BIRTHDATE:       07/28/1941     VISIT #:     [phone] ATTENDING:     Carol Ada, MD     STATUS:     outpatient ASSISTANT:      Dustin Flock and Lindaann Slough MD: ASA CLASS:        Class III  INDICATIONS:  The patient is a 74 yr old male here for a lower endoscopic ultrasound due to Marginal zone B-cell lymphoma.  PROCEDURE PERFORMED:     EUS with cold biopsies.  MEDICATIONS:     Monitored anesthesia care  CONSENT: The patient understands the risks and benefits of the procedure and understands that these risks include, but are not limited to: sedation, allergic reaction, infection, perforation and/or bleeding. Alternative means of evaluation and treatment include, among others: physical exam, x-rays, and/or surgical intervention. The patient elects to proceed with this endoscopic procedure.  DESCRIPTION OF PROCEDURE: During intra-op preparation period all mechanical & medical equipment was checked for proper function. Hand hygiene and appropriate measures for infection prevention was taken. After the risks, benefits and alternatives of the procedure were thoroughly explained, Informed consent was verified, confirmed and timeout was successfully executed by the treatment team. The patient was then placed in the left, lateral, decubitus position and IV sedation was administered. Throughout the procedure, the patients blood pressure, pulse and oxygen saturations were monitored continuously. Under direct visualization, the    was introduced through the mouth and advanced to the second portion of the duodenum.  Water was used as necessary to provide an acoustic interface. The pulse, BP, and O2 saturation were monitored and documented by the physician  and nursing staff throughout the procedure. Upon completion of the imaging, water was removed and the patient was then discharged to recovery in stable condition with the appropriate post procedure care. Estimated blood loss is zero unless otherwise noted in this procedure report.  FINDINGS: Gross and endoscopic views of the fundus were obtained with the procedure.  There was no gross evidence of any mucosal abnormalities.  The EUS failed to demonstrate any thickening of the mucosa.  No other ulcerations or erosions identfied with gross inspection.  Multiple cold biopsies were obtained in the fundus as this was the location of the Marginal zone B-cell lymphoma.  The pancreas, CBD, Celiac axis, SMA, and PV were normal.    ADVERSE EVENTS:     There were no immediate complications. IMPRESSIONS:  RECOMMENDATIONS:  ___________________________________ Carol Ada, MD eSigned:  Carol Ada, MD 05/21/2015 11:33 AM   cc:

## 2015-05-21 NOTE — Anesthesia Postprocedure Evaluation (Signed)
  Anesthesia Post-op Note  Patient: Jonathan Green  Procedure(s) Performed: Procedure(s) (LRB): FULL UPPER ENDOSCOPIC ULTRASOUND (EUS) RADIAL (N/A)  Patient Location: PACU  Anesthesia Type: MAC  Level of Consciousness: awake and alert   Airway and Oxygen Therapy: Patient Spontanous Breathing  Post-op Pain: mild  Post-op Assessment: Post-op Vital signs reviewed, Patient's Cardiovascular Status Stable, Respiratory Function Stable, Patent Airway and No signs of Nausea or vomiting  Last Vitals:  Filed Vitals:   05/21/15 1150  BP: 117/62  Pulse: 61  Temp:   Resp: 13    Post-op Vital Signs: stable   Complications: No apparent anesthesia complications

## 2015-05-21 NOTE — Transfer of Care (Signed)
Immediate Anesthesia Transfer of Care Note  Patient: Jonathan Green  Procedure(s) Performed: Procedure(s): FULL UPPER ENDOSCOPIC ULTRASOUND (EUS) RADIAL (N/A)  Patient Location: PACU and Endoscopy Unit  Anesthesia Type:MAC  Level of Consciousness: sedated and patient cooperative  Airway & Oxygen Therapy: Patient Spontanous Breathing and Patient connected to face mask oxygen  Post-op Assessment: Report given to RN and Post -op Vital signs reviewed and stable  Post vital signs: Reviewed and stable  Last Vitals:  Filed Vitals:   05/21/15 0938  BP: 133/69  Temp:   Resp:     Complications: No apparent anesthesia complications

## 2015-05-24 ENCOUNTER — Encounter (HOSPITAL_COMMUNITY): Payer: Self-pay | Admitting: Gastroenterology

## 2015-06-07 ENCOUNTER — Telehealth: Payer: Self-pay | Admitting: Hematology and Oncology

## 2015-06-07 NOTE — Telephone Encounter (Signed)
new patient appt-s/w Butch Penny and gave np appt for 10/11 @ 10:45 w/Dr. Alvy Bimler.

## 2015-06-15 ENCOUNTER — Encounter: Payer: Self-pay | Admitting: Hematology and Oncology

## 2015-06-15 ENCOUNTER — Ambulatory Visit (HOSPITAL_BASED_OUTPATIENT_CLINIC_OR_DEPARTMENT_OTHER): Payer: Commercial Managed Care - HMO | Admitting: Hematology and Oncology

## 2015-06-15 ENCOUNTER — Ambulatory Visit (HOSPITAL_BASED_OUTPATIENT_CLINIC_OR_DEPARTMENT_OTHER): Payer: Commercial Managed Care - HMO

## 2015-06-15 ENCOUNTER — Telehealth: Payer: Self-pay | Admitting: Hematology and Oncology

## 2015-06-15 VITALS — BP 127/59 | HR 56 | Temp 97.5°F | Resp 20 | Ht 68.0 in | Wt 159.9 lb

## 2015-06-15 DIAGNOSIS — Z720 Tobacco use: Secondary | ICD-10-CM

## 2015-06-15 DIAGNOSIS — C884 Extranodal marginal zone B-cell lymphoma of mucosa-associated lymphoid tissue [MALT-lymphoma]: Secondary | ICD-10-CM | POA: Diagnosis not present

## 2015-06-15 DIAGNOSIS — C8389 Other non-follicular lymphoma, extranodal and solid organ sites: Secondary | ICD-10-CM | POA: Diagnosis not present

## 2015-06-15 DIAGNOSIS — D72821 Monocytosis (symptomatic): Secondary | ICD-10-CM | POA: Insufficient documentation

## 2015-06-15 DIAGNOSIS — D509 Iron deficiency anemia, unspecified: Secondary | ICD-10-CM | POA: Diagnosis not present

## 2015-06-15 DIAGNOSIS — F17200 Nicotine dependence, unspecified, uncomplicated: Secondary | ICD-10-CM

## 2015-06-15 HISTORY — DX: Iron deficiency anemia, unspecified: D50.9

## 2015-06-15 HISTORY — DX: Nicotine dependence, unspecified, uncomplicated: F17.200

## 2015-06-15 LAB — COMPREHENSIVE METABOLIC PANEL (CC13)
ALBUMIN: 3.8 g/dL (ref 3.5–5.0)
ALK PHOS: 66 U/L (ref 40–150)
ALT: <9 U/L (ref 0–55)
AST: 15 U/L (ref 5–34)
Anion Gap: 7 mEq/L (ref 3–11)
BILIRUBIN TOTAL: 0.34 mg/dL (ref 0.20–1.20)
BUN: 14 mg/dL (ref 7.0–26.0)
CALCIUM: 8.9 mg/dL (ref 8.4–10.4)
CO2: 26 mEq/L (ref 22–29)
CREATININE: 1 mg/dL (ref 0.7–1.3)
Chloride: 108 mEq/L (ref 98–109)
EGFR: 78 mL/min/{1.73_m2} — ABNORMAL LOW (ref >90)
Glucose: 106 mg/dl (ref 70–140)
POTASSIUM: 4.5 meq/L (ref 3.5–5.1)
Sodium: 141 mEq/L (ref 136–145)
Total Protein: 6.7 g/dL (ref 6.4–8.3)

## 2015-06-15 LAB — CBC WITH DIFFERENTIAL/PLATELET
BASO%: 2 % (ref 0.0–2.0)
Basophils Absolute: 0.1 10*3/uL (ref 0.0–0.1)
EOS%: 0.5 % (ref 0.0–7.0)
Eosinophils Absolute: 0 10*3/uL (ref 0.0–0.5)
HCT: 29.1 % — ABNORMAL LOW (ref 38.4–49.9)
HGB: 9.4 g/dL — ABNORMAL LOW (ref 13.0–17.1)
LYMPH%: 33.9 % (ref 14.0–49.0)
MCH: 29.9 pg (ref 27.2–33.4)
MCHC: 32.3 g/dL (ref 32.0–36.0)
MCV: 92.7 fL (ref 79.3–98.0)
MONO#: 2.4 10*3/uL — AB (ref 0.1–0.9)
MONO%: 40 % — AB (ref 0.0–14.0)
NEUT%: 23.6 % — ABNORMAL LOW (ref 39.0–75.0)
NEUTROS ABS: 1.4 10*3/uL — AB (ref 1.5–6.5)
Platelets: 122 10*3/uL — ABNORMAL LOW (ref 140–400)
RBC: 3.14 10*6/uL — AB (ref 4.20–5.82)
RDW: 15.5 % — ABNORMAL HIGH (ref 11.0–14.6)
WBC: 5.9 10*3/uL (ref 4.0–10.3)
lymph#: 2 10*3/uL (ref 0.9–3.3)
nRBC: 0 % (ref 0–0)

## 2015-06-15 LAB — MORPHOLOGY: PLT EST: DECREASED

## 2015-06-15 LAB — IRON AND TIBC CHCC
%SAT: 31 % (ref 20–55)
IRON: 76 ug/dL (ref 42–163)
TIBC: 241 ug/dL (ref 202–409)
UIBC: 165 ug/dL (ref 117–376)

## 2015-06-15 LAB — LACTATE DEHYDROGENASE (CC13): LDH: 233 U/L (ref 125–245)

## 2015-06-15 LAB — FERRITIN CHCC: Ferritin: 137 ng/ml (ref 22–316)

## 2015-06-15 NOTE — Telephone Encounter (Signed)
Gave and printed appt sched and avs fo rpt for OCT °

## 2015-06-15 NOTE — Assessment & Plan Note (Signed)
I recommend PET CT scan for staging. Clinically, he is not symptomatic. I will get his case presented at the next hematology tumor board. He was treated with 1 course of triple therapy early this year and we might have to consider treating him again if PET CT scan showed localized disease. Alternatively, single agent rituximab may be an option. I will see him back next week to review test results.

## 2015-06-15 NOTE — Assessment & Plan Note (Signed)
He has evidence of iron deficiency anemia. He has been treated adequately recently. I will recheck iron studies. Currently he is not symptomatic.

## 2015-06-15 NOTE — Assessment & Plan Note (Signed)
He has chronic monocytosis of unknown etiology. I suspect, along with anemia, the patient may have undiagnosed CMML.  I will review test results again after PET CT scan next week. Currently he is not symptomatic

## 2015-06-15 NOTE — Progress Notes (Signed)
[facility] CONSULT NOTE  Patient Care Team: Denita Lung, MD as PCP - General (Family Medicine) Carol Ada, MD as Consulting Physician (Gastroenterology)  CHIEF COMPLAINTS/PURPOSE OF CONSULTATION:   gastric MALT lymphoma  HISTORY OF PRESENTING ILLNESS:  Jonathan Green 74 y.o. male is here because of  Recent diagnosis of MALT lymphoma.  this patient was noted to have microcytic anemia from blood draw. He was subsequently referred to gastroenterologist for evaluation. I review his records extensively and summarized as follows:   MALT lymphoma ([place])   03/09/2015 Procedure  he had EGD and colonoscopy, biopsy shows stoma MALT, H. pylori positive. Colonoscopy showed angiodysplastic lesions and polyps along with diverticula   03/09/2015 Pathology Results  gastric biopsy showed stomach MALT   04/28/2015 Miscellaneous  urea breath test was negative for H. pylori. Iron studies were within normal limits   05/21/2015 Procedure  EGD is normal. Random biopsy was negative   05/21/2015 Pathology Results  biopsies suspicious for residual lymphoma     The patient say he has been feeling fine however his wife stated the patient have some mild weight loss over the last 6 months. Apparently, the patient was treated with triple therapy prior to repeat endoscopy in  September 2016. The patient denies any recent signs or symptoms of bleeding such as spontaneous epistaxis, hematuria or hematochezia.   MEDICAL HISTORY:  Past Medical History  Diagnosis Date  . Helicobacter pylori gastritis (chronic gastritis) 03/17/15  . Adenomatous polyp of colon 03/17/15  . MALT lymphoma ([place]) 03/17/15    "found in stomach"  . Anemia   . MALT lymphoma ([place]) 06/15/2015  . Iron deficiency anemia 06/15/2015  . Smoker unmotivated to quit 06/15/2015    SURGICAL HISTORY: Past Surgical History  Procedure Laterality Date  . Hydrocele excision  09/22/2011    Procedure: HYDROCELECTOMY ADULT;  Surgeon: Claybon Jabs, MD;  Location: [facility];  Service: Urology;  Laterality: Left;  . Esophagogastroduodenoscopy endoscopy      prior- Dr. Ulyses Amor office  . Eus N/A 05/21/2015    Procedure: FULL UPPER ENDOSCOPIC ULTRASOUND (EUS) RADIAL;  Surgeon: Carol Ada, MD;  Location: [facility];  Service: Endoscopy;  Laterality: N/A;  . Colonscopy      SOCIAL HISTORY: Social History   Social History  . Marital Status: Married    Spouse Name: N/A  . Number of Children: N/A  . Years of Education: N/A   Occupational History  . retired Engineer, materials)    Social History Main Topics  . Smoking status: Current Some Day Smoker -- 50 years    Types: Cigars  . Smokeless tobacco: Never Used     Comment: averages 4 cigars per day  . Alcohol Use: No  . Drug Use: No  . Sexual Activity: Not Currently     Comment: retired, lives with wife   Other Topics Concern  . Not on file   Social History Narrative    FAMILY HISTORY: Family History  Problem Relation Age of Onset  . Cancer Mother     unknown ca  . Cancer Father     throat ca    ALLERGIES:  is allergic to codeine.  MEDICATIONS:  Current Outpatient Prescriptions  Medication Sig Dispense Refill  . ibuprofen (ADVIL,MOTRIN) 200 MG tablet Take 400 mg by mouth every 6 (six) hours as needed for moderate pain.     No current facility-administered medications for this visit.    REVIEW OF SYSTEMS:   Constitutional: Denies  fevers, chills or abnormal night sweats Eyes: Denies blurriness of vision, double vision or watery eyes Ears, nose, mouth, throat, and face: Denies mucositis or sore throat Respiratory: Denies cough, dyspnea or wheezes Cardiovascular: Denies palpitation, chest discomfort or lower extremity swelling Gastrointestinal:  Denies nausea, heartburn or change in bowel habits Skin: Denies abnormal skin rashes Lymphatics: Denies new lymphadenopathy or easy bruising Neurological:Denies numbness, tingling or new  weaknesses Behavioral/Psych: Mood is stable, no new changes  All other systems were reviewed with the patient and are negative.  PHYSICAL EXAMINATION: ECOG PERFORMANCE STATUS: 0 - Asymptomatic  Filed Vitals:   06/15/15 1026  BP: 127/59  Pulse: 56  Temp: 97.5 F (36.4 C)  Resp: 20   Filed Weights   06/15/15 1026  Weight: 159 lb 14.4 oz (72.53 kg)    GENERAL:alert, no distress and comfortable SKIN: skin color, texture, turgor are normal, no rashes or significant lesions EYES: normal, conjunctiva are pink and non-injected, sclera clear OROPHARYNX:no exudate, no erythema and lips, buccal mucosa, and tongue normal  NECK: supple, thyroid normal size, non-tender, without nodularity LYMPH:  no palpable lymphadenopathy in the cervical, axillary or inguinal LUNGS: clear to auscultation and percussion with normal breathing effort HEART: regular rate & rhythm and no murmurs and no lower extremity edema ABDOMEN:abdomen soft, non-tender and normal bowel sounds Musculoskeletal:no cyanosis of digits and no clubbing  PSYCH: alert & oriented x 3 with fluent speech NEURO: no focal motor/sensory deficits  LABORATORY DATA:  I have reviewed the data as listed Lab Results  Component Value Date   WBC 5.9 06/15/2015   HGB 9.4* 06/15/2015   HCT 29.1* 06/15/2015   MCV 92.7 06/15/2015   PLT 122* 06/15/2015    Recent Labs  02/22/15 0001 06/15/15 1135  NA 141 141  K 4.5 4.5  CL 104  --   CO2 26 26  GLUCOSE 86 106  BUN 17 14.0  CREATININE 1.03 1.0  CALCIUM 8.4 8.9  PROT 6.2 6.7  ALBUMIN 3.8 3.8  AST 14 15  ALT 8 <9  ALKPHOS 51 66  BILITOT 0.5 0.34    ASSESSMENT & PLAN:  MALT lymphoma ([place])  I recommend PET CT scan for staging. Clinically, he is not symptomatic. I will get his case presented at the next hematology tumor board. He was treated with 1 course of triple therapy early this year and we might have to consider treating him again if PET CT scan showed localized  disease. Alternatively, single agent rituximab may be an option. I will see him back next week to review test results.  Chronic idiopathic monocytosis He has chronic monocytosis of unknown etiology. I suspect, along with anemia, the patient may have undiagnosed CMML.  I will review test results again after PET CT scan next week. Currently he is not symptomatic  Iron deficiency anemia He has evidence of iron deficiency anemia. He has been treated adequately recently. I will recheck iron studies. Currently he is not symptomatic.  Current smoker on some days I spent some time counseling the patient the importance of tobacco cessation. He is currently not interested to quit now.   We discussed the importance of preventive care and reviewed the vaccination programs. He does not have any prior allergic reactions to influenza vaccination. He declined to proceed with influenza vaccination today.    All questions were answered. The patient knows to call the clinic with any problems, questions or concerns. I spent 40 minutes counseling the patient face to face. The total  time spent in the appointment was 60 minutes and more than 50% was on counseling.     [place], [place], MD 06/15/2015 1:22 PM

## 2015-06-15 NOTE — Assessment & Plan Note (Signed)
I spent some time counseling the patient the importance of tobacco cessation. He is currently not interested to quit now.  

## 2015-06-16 LAB — HEPATITIS B SURFACE ANTIBODY,QUALITATIVE: HEP B S AB: NEGATIVE

## 2015-06-16 LAB — HEPATITIS B SURFACE ANTIGEN: HEP B S AG: NEGATIVE

## 2015-06-16 LAB — HEPATITIS B CORE ANTIBODY, IGM: Hep B C IgM: NONREACTIVE

## 2015-06-17 ENCOUNTER — Telehealth: Payer: Self-pay | Admitting: *Deleted

## 2015-06-17 NOTE — Telephone Encounter (Signed)
TC from pt's wife stating that pt is scheduled to see Dr. Alvy Bimler on 06/23/15 and that pt needs a PET Scan prior to that appt.  His scan has been scheduled for 06/28/15. Wife wants to know if that can be changed to before his appt on the 19th.

## 2015-06-17 NOTE — Telephone Encounter (Signed)
Per Benedetto Goad in [place] the PET is approved by [facility].  Wife can call the [place] at [phone] to find out the co-pay.    Informed wife of above and she verbalized understanding.

## 2015-06-17 NOTE — Telephone Encounter (Signed)
PET r/s to Tues 10/18 at 8:30 am.   Pt to arrive to [facility] Radiology Dept at 8 am and NPO after midnight.   LVM for wife informing of new appt d/t.   Asked her to please call nurse back to confirm.

## 2015-06-17 NOTE — Telephone Encounter (Signed)
Wife called back to confirm PET scan moved to 10/18.  She wants to make sure [employer] has approved it and wants to know what the co-pay will be?   Informed her I will try to find out this information and call her back.

## 2015-06-17 NOTE — Telephone Encounter (Signed)
PET [place], can you call to see if we can move it to be done sooner?

## 2015-06-22 ENCOUNTER — Encounter (HOSPITAL_COMMUNITY)
Admission: RE | Admit: 2015-06-22 | Discharge: 2015-06-22 | Disposition: A | Discharge status: Home / Self Care | Payer: Commercial Managed Care - HMO | Source: Ambulatory Visit | Attending: Hematology and Oncology | Admitting: Hematology and Oncology

## 2015-06-22 DIAGNOSIS — C884 Extranodal marginal zone B-cell lymphoma of mucosa-associated lymphoid tissue [MALT-lymphoma]: Secondary | ICD-10-CM | POA: Insufficient documentation

## 2015-06-22 DIAGNOSIS — N200 Calculus of kidney: Secondary | ICD-10-CM | POA: Diagnosis not present

## 2015-06-22 DIAGNOSIS — R911 Solitary pulmonary nodule: Secondary | ICD-10-CM | POA: Diagnosis not present

## 2015-06-22 DIAGNOSIS — I2510 Atherosclerotic heart disease of native coronary artery without angina pectoris: Secondary | ICD-10-CM | POA: Diagnosis not present

## 2015-06-22 DIAGNOSIS — I700 Atherosclerosis of aorta: Secondary | ICD-10-CM | POA: Diagnosis not present

## 2015-06-22 DIAGNOSIS — D509 Iron deficiency anemia, unspecified: Secondary | ICD-10-CM | POA: Diagnosis not present

## 2015-06-22 LAB — GLUCOSE, CAPILLARY: GLUCOSE-CAPILLARY: 98 mg/dL (ref 65–99)

## 2015-06-22 MED ORDER — FLUDEOXYGLUCOSE F - 18 (FDG) INJECTION
7.8600 | Freq: Once | INTRAVENOUS | Status: DC | PRN
  Administered 2015-06-22: 7.86 via INTRAVENOUS
  Filled 2015-06-22: qty 7.86

## 2015-06-23 ENCOUNTER — Encounter: Payer: Self-pay | Admitting: Hematology and Oncology

## 2015-06-23 ENCOUNTER — Telehealth: Payer: Self-pay | Admitting: Hematology and Oncology

## 2015-06-23 ENCOUNTER — Ambulatory Visit (HOSPITAL_BASED_OUTPATIENT_CLINIC_OR_DEPARTMENT_OTHER): Payer: Commercial Managed Care - HMO | Admitting: Hematology and Oncology

## 2015-06-23 VITALS — BP 133/57 | HR 59 | Temp 97.7°F | Resp 18 | Ht 68.0 in | Wt 153.1 lb

## 2015-06-23 DIAGNOSIS — R634 Abnormal weight loss: Secondary | ICD-10-CM | POA: Diagnosis not present

## 2015-06-23 DIAGNOSIS — D61818 Other pancytopenia: Secondary | ICD-10-CM | POA: Diagnosis not present

## 2015-06-23 DIAGNOSIS — C884 Extranodal marginal zone B-cell lymphoma of mucosa-associated lymphoid tissue [MALT-lymphoma]: Secondary | ICD-10-CM | POA: Diagnosis not present

## 2015-06-23 NOTE — Assessment & Plan Note (Signed)
I have reviewed his case at that hematology tumor board yesterday. I review all the reports and PET scan with the patient and family. I suspect he may have residual disease in the stomach. We discussed the use of rituximab treatment for low low-grade, MALT lymphoma. I recommend he attends chemotherapy education class. I will hold off treatment until further bone marrow evaluation for pancytopenia.

## 2015-06-23 NOTE — Patient Instructions (Signed)
Rituximab injection What is this medicine? RITUXIMAB (ri TUX i mab) is a monoclonal antibody. It is used commonly to treat non-Hodgkin lymphoma and other conditions. It is also used to treat rheumatoid arthritis (RA). In RA, this medicine slows the inflammatory process and help reduce joint pain and swelling. This medicine is often used with other cancer or arthritis medications. This medicine may be used for other purposes; ask your health care provider or pharmacist if you have questions. What should I tell my health care provider before I take this medicine? They need to know if you have any of these conditions: -blood disorders -heart disease -history of hepatitis B -infection (especially a virus infection such as chickenpox, cold sores, or herpes) -irregular heartbeat -kidney disease -lung or breathing disease, like asthma -lupus -an unusual or allergic reaction to rituximab, mouse proteins, other medicines, foods, dyes, or preservatives -pregnant or trying to get pregnant -breast-feeding How should I use this medicine? This medicine is for infusion into a vein. It is administered in a hospital or clinic by a specially trained health care professional. A special MedGuide will be given to you by the pharmacist with each prescription and refill. Be sure to read this information carefully each time. Talk to your pediatrician regarding the use of this medicine in children. This medicine is not approved for use in children. Overdosage: If you think you have taken too much of this medicine contact a poison control center or emergency room at once. NOTE: This medicine is only for you. Do not share this medicine with others. What if I miss a dose? It is important not to miss a dose. Call your doctor or health care professional if you are unable to keep an appointment. What may interact with this medicine? -cisplatin -medicines for blood pressure -some other medicines for  arthritis -vaccines This list may not describe all possible interactions. Give your health care provider a list of all the medicines, herbs, non-prescription drugs, or dietary supplements you use. Also tell them if you smoke, drink alcohol, or use illegal drugs. Some items may interact with your medicine. What should I watch for while using this medicine? Report any side effects that you notice during your treatment right away, such as changes in your breathing, fever, chills, dizziness or lightheadedness. These effects are more common with the first dose. Visit your prescriber or health care professional for checks on your progress. You will need to have regular blood work. Report any other side effects. The side effects of this medicine can continue after you finish your treatment. Continue your course of treatment even though you feel ill unless your doctor tells you to stop. Call your doctor or health care professional for advice if you get a fever, chills or sore throat, or other symptoms of a cold or flu. Do not treat yourself. This drug decreases your body's ability to fight infections. Try to avoid being around people who are sick. This medicine may increase your risk to bruise or bleed. Call your doctor or health care professional if you notice any unusual bleeding. Be careful brushing and flossing your teeth or using a toothpick because you may get an infection or bleed more easily. If you have any dental work done, tell your dentist you are receiving this medicine. Avoid taking products that contain aspirin, acetaminophen, ibuprofen, naproxen, or ketoprofen unless instructed by your doctor. These medicines may hide a fever. Do not become pregnant while taking this medicine. Women should inform their doctor if   they wish to become pregnant or think they might be pregnant. There is a potential for serious side effects to an unborn child. Talk to your health care professional or pharmacist for more  information. Do not breast-feed an infant while taking this medicine. What side effects may I notice from receiving this medicine? Side effects that you should report to your doctor or health care professional as soon as possible: -allergic reactions like skin rash, itching or hives, swelling of the face, lips, or tongue -low blood counts - this medicine may decrease the number of white blood cells, red blood cells and platelets. You may be at increased risk for infections and bleeding. -signs of infection - fever or chills, cough, sore throat, pain or difficulty passing urine -signs of decreased platelets or bleeding - bruising, pinpoint red spots on the skin, black, tarry stools, blood in the urine -signs of decreased red blood cells - unusually weak or tired, fainting spells, lightheadedness -breathing problems -confused, not responsive -chest pain -fast, irregular heartbeat -feeling faint or lightheaded, falls -mouth sores -redness, blistering, peeling or loosening of the skin, including inside the mouth -stomach pain -swelling of the ankles, feet, or hands -trouble passing urine or change in the amount of urine Side effects that usually do not require medical attention (report to your doctor or other health care professional if they continue or are bothersome): -anxiety -headache -loss of appetite -muscle aches -nausea -night sweats This list may not describe all possible side effects. Call your doctor for medical advice about side effects. You may report side effects to FDA at 1-800-FDA-1088. Where should I keep my medicine? This drug is given in a hospital or clinic and will not be stored at home. NOTE: This sheet is a summary. It may not cover all possible information. If you have questions about this medicine, talk to your doctor, pharmacist, or health care provider.    2016, Elsevier/Gold Standard. (2014-10-28 22:30:56)  

## 2015-06-23 NOTE — Assessment & Plan Note (Signed)
He has recent weight loss. Clinically, he does not appear to be malnourished. I recommended increase dietary supplement We will proceed with further evaluation including bone marrow biopsy and future treatment for lymphoma.

## 2015-06-23 NOTE — Progress Notes (Signed)
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 pylori positive. Colonoscopy showed angiodysplastic lesions and polyps along with diverticula   03/09/2015 Pathology Results  gastric biopsy showed stomach MALT   04/28/2015 Miscellaneous  urea breath test was negative for H. pylori. Iron studies were within normal limits   05/21/2015 Procedure  EGD is normal. Random biopsy was negative   05/21/2015 Pathology Results  biopsies suspicious for residual lymphoma   06/22/2015 Imaging PET CT scan show abnormal metabolic activity in the stomach region, suspicious for residual lymphoma    INTERVAL HISTORY: Please see below for problem oriented charting. He returns for further follow-up. His wife is present. He feels well.  REVIEW OF SYSTEMS:   Constitutional: Denies fevers, chills or abnormal weight loss Eyes: Denies blurriness of vision Ears, nose, mouth, throat, and face: Denies mucositis or sore throat Respiratory: Denies cough, dyspnea or wheezes Cardiovascular: Denies palpitation, chest discomfort or lower extremity swelling Gastrointestinal:  Denies nausea, heartburn or change in bowel habits Skin: Denies abnormal skin rashes Lymphatics: Denies new lymphadenopathy or easy bruising Neurological:Denies numbness, tingling or new weaknesses Behavioral/Psych: Mood is stable, no new changes  All other systems were reviewed with the patient and are negative.  I have reviewed the past medical history, past surgical history, social history and family history with the patient and they are unchanged from previous note.  ALLERGIES:  is allergic to codeine.  MEDICATIONS:  Current Outpatient Prescriptions  Medication Sig Dispense Refill   . ibuprofen (ADVIL,MOTRIN) 200 MG tablet Take 400 mg by mouth every 6 (six) hours as needed for moderate pain.     No current facility-administered medications for this visit.   Facility-Administered Medications Ordered in Other Visits  Medication Dose Route Frequency Provider Last Rate Last Dose  . fludeoxyglucose F - 18 (FDG) injection 7.86 milli Curie  7.86 milli Curie Intravenous Once PRN Medication Radiologist, MD   7.86 milli Curie at 06/22/15 [phone]    PHYSICAL EXAMINATION: ECOG PERFORMANCE STATUS: 0 - Asymptomatic  Filed Vitals:   06/23/15 1052  BP: 133/57  Pulse: 59  Temp: 97.7 F (36.5 C)  Resp: 18   Filed Weights   06/23/15 1052  Weight: 153 lb 1.6 oz (69.446 kg)    GENERAL:alert, no distress and comfortable SKIN: skin color, texture, turgor are normal, no rashes or significant lesions EYES: normal, Conjunctiva are pink and non-injected, sclera clear Musculoskeletal:no cyanosis of digits and no clubbing  NEURO: alert & oriented x 3 with fluent speech, no focal motor/sensory deficits  LABORATORY DATA:  I have reviewed the data as listed    Component Value Date/Time   NA 141 06/15/2015 1135   NA 141 02/22/2015 0001   K 4.5 06/15/2015 1135   K 4.5 02/22/2015 0001   CL 104 02/22/2015 0001   CO2 26 06/15/2015 1135   CO2 26 02/22/2015 0001   GLUCOSE 106 06/15/2015 1135   GLUCOSE 86 02/22/2015 0001   BUN 14.0 06/15/2015 1135   BUN 17 02/22/2015 0001   CREATININE 1.0 06/15/2015 1135   CREATININE 1.03 02/22/2015 0001   CALCIUM 8.9 06/15/2015 1135   CALCIUM 8.4 02/22/2015 0001   PROT 6.7 06/15/2015 1135   PROT 6.2 02/22/2015 0001   ALBUMIN 3.8 06/15/2015 1135  ALBUMIN 3.8 02/22/2015 0001   AST 15 06/15/2015 1135   AST 14 02/22/2015 0001   ALT <9 06/15/2015 1135   ALT 8 02/22/2015 0001   ALKPHOS 66 06/15/2015 1135   ALKPHOS 51 02/22/2015 0001   BILITOT 0.34 06/15/2015 1135   BILITOT 0.5 02/22/2015 0001    No results found for: SPEP, UPEP  Lab Results   Component Value Date   WBC 5.9 06/15/2015   NEUTROABS 1.4* 06/15/2015   HGB 9.4* 06/15/2015   HCT 29.1* 06/15/2015   MCV 92.7 06/15/2015   PLT 122* 06/15/2015      Chemistry      Component Value Date/Time   NA 141 06/15/2015 1135   NA 141 02/22/2015 0001   K 4.5 06/15/2015 1135   K 4.5 02/22/2015 0001   CL 104 02/22/2015 0001   CO2 26 06/15/2015 1135   CO2 26 02/22/2015 0001   BUN 14.0 06/15/2015 1135   BUN 17 02/22/2015 0001   CREATININE 1.0 06/15/2015 1135   CREATININE 1.03 02/22/2015 0001      Component Value Date/Time   CALCIUM 8.9 06/15/2015 1135   CALCIUM 8.4 02/22/2015 0001   ALKPHOS 66 06/15/2015 1135   ALKPHOS 51 02/22/2015 0001   AST 15 06/15/2015 1135   AST 14 02/22/2015 0001   ALT <9 06/15/2015 1135   ALT 8 02/22/2015 0001   BILITOT 0.34 06/15/2015 1135   BILITOT 0.5 02/22/2015 0001       RADIOGRAPHIC STUDIES: I have personally reviewed the radiological images as listed and agreed with the findings in the report. Nm Pet Image Initial (pi) Skull Base To Thigh  06/22/2015  CLINICAL DATA:  Initial treatment strategy for gastric MALT lymphoma. EXAM: NUCLEAR MEDICINE PET SKULL BASE TO THIGH TECHNIQUE: 7.86 mCi F-18 FDG was injected intravenously. Full-ring PET imaging was performed from the skull base to thigh after the radiotracer. CT data was obtained and used for attenuation correction and anatomic localization. FASTING BLOOD GLUCOSE:  Value: 98 mg/dl COMPARISON:  None. FINDINGS: NECK No hypermetabolic cervical lymph nodes are identified.There is mildly prominent but symmetric hypermetabolic activity within the palatine tonsils (SUV max 6.9). Mildly prominent activity within the submandibular glands also appears symmetric and is likely physiologic. The parotid activity appears unremarkable. No focal lesions of the pharyngeal mucosal space identified. CHEST There are no hypermetabolic mediastinal, hilar or axillary lymph nodes. There is no abnormal pulmonary  metabolic activity. 4 mm nodule in the superior segment of the right lower lobe is noted on image 22. No other pulmonary nodules identified. There is mild atherosclerosis of the aorta, great vessels and coronary arteries. ABDOMEN/PELVIS There is no hypermetabolic activity within the liver, adrenal glands, spleen or pancreas. There is no hypermetabolic nodal activity. No abnormal activity is seen within the gastrointestinal tract. Nonobstructing left renal calculi and mild aortoiliac atherosclerosis noted. SKELETON There is no hypermetabolic activity to suggest osseous metastatic disease. There are degenerative changes within the lower lumbar spine. IMPRESSION: 1. No definite abnormal metabolic activity to suggest active lymphoma. Specifically, no focal abnormal activity seen within the stomach. 2. Nonspecific symmetric activity within the palatine tonsils and submandibular glands, probably physiologic. 3. Nonspecific 4 mm nodule in the superior segment of the right lower lobe. 4. Atherosclerosis and left renal calculi noted. Electronically Signed   By: Richardean Sale M.D.   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 I review  all the reports and PET scan with the patient and family. I suspect he may have residual disease in the stomach. We discussed the use of rituximab treatment for low low-grade, MALT lymphoma. I recommend he attends chemotherapy education class. I will hold off treatment until further bone marrow evaluation for pancytopenia.  Pancytopenia ([place]) The patient is noted to have pancytopenia with anemia, thrombocytopenia and monocytosis. I suspect he may have bone marrow disorder. I discussed with the patient the rationale behind bone marrow biopsy to evaluate pancytopenia and to complete staging for lymphoma. He agreed to proceed with unsedated bone marrow tomorrow.  Recent weight loss He has  recent weight loss. Clinically, he does not appear to be malnourished. I recommended increase dietary supplement We will proceed with further evaluation including bone marrow biopsy and future treatment for lymphoma.   No orders of the defined types were placed in this encounter.   All questions were answered. The patient knows to call the clinic with any problems, questions or concerns. No barriers to learning was detected. I spent 25 minutes counseling the patient face to face. The total time spent in the appointment was 40 minutes and more than 50% was on counseling and review of test results     [facility], [place], MD 06/23/2015 1:45 PM

## 2015-06-23 NOTE — Telephone Encounter (Signed)
per pof to sch pt appt-gave pt copy of avs °

## 2015-06-23 NOTE — Assessment & Plan Note (Signed)
The patient is noted to have pancytopenia with anemia, thrombocytopenia and monocytosis. I suspect he may have bone marrow disorder. I discussed with the patient the rationale behind bone marrow biopsy to evaluate pancytopenia and to complete staging for lymphoma. He agreed to proceed with unsedated bone marrow tomorrow.

## 2015-06-24 ENCOUNTER — Ambulatory Visit (HOSPITAL_BASED_OUTPATIENT_CLINIC_OR_DEPARTMENT_OTHER): Payer: Commercial Managed Care - HMO | Admitting: Hematology and Oncology

## 2015-06-24 ENCOUNTER — Other Ambulatory Visit (HOSPITAL_COMMUNITY)
Admission: RE | Admit: 2015-06-24 | Discharge: 2015-06-24 | Disposition: A | Discharge status: Home / Self Care | Payer: Commercial Managed Care - HMO | Source: Ambulatory Visit | Attending: Hematology and Oncology | Admitting: Hematology and Oncology

## 2015-06-24 ENCOUNTER — Other Ambulatory Visit (HOSPITAL_BASED_OUTPATIENT_CLINIC_OR_DEPARTMENT_OTHER): Payer: Commercial Managed Care - HMO

## 2015-06-24 ENCOUNTER — Encounter: Payer: Self-pay | Admitting: Hematology and Oncology

## 2015-06-24 ENCOUNTER — Other Ambulatory Visit: Payer: Self-pay | Admitting: Hematology and Oncology

## 2015-06-24 VITALS — BP 141/62 | HR 51 | Temp 97.0°F | Resp 18

## 2015-06-24 DIAGNOSIS — D696 Thrombocytopenia, unspecified: Secondary | ICD-10-CM | POA: Insufficient documentation

## 2015-06-24 DIAGNOSIS — C884 Extranodal marginal zone B-cell lymphoma of mucosa-associated lymphoid tissue [MALT-lymphoma]: Secondary | ICD-10-CM

## 2015-06-24 DIAGNOSIS — D61818 Other pancytopenia: Secondary | ICD-10-CM | POA: Diagnosis not present

## 2015-06-24 DIAGNOSIS — C8590 Non-Hodgkin lymphoma, unspecified, unspecified site: Secondary | ICD-10-CM | POA: Diagnosis not present

## 2015-06-24 DIAGNOSIS — C9310 Chronic myelomonocytic leukemia not having achieved remission: Secondary | ICD-10-CM

## 2015-06-24 DIAGNOSIS — D72821 Monocytosis (symptomatic): Secondary | ICD-10-CM | POA: Diagnosis not present

## 2015-06-24 DIAGNOSIS — D649 Anemia, unspecified: Secondary | ICD-10-CM | POA: Insufficient documentation

## 2015-06-24 DIAGNOSIS — D509 Iron deficiency anemia, unspecified: Secondary | ICD-10-CM | POA: Diagnosis not present

## 2015-06-24 HISTORY — DX: Chronic myelomonocytic leukemia not having achieved remission: C93.10

## 2015-06-24 LAB — CBC WITH DIFFERENTIAL/PLATELET
BASO%: 2.4 % — AB (ref 0.0–2.0)
Basophils Absolute: 0.2 10*3/uL — ABNORMAL HIGH (ref 0.0–0.1)
EOS%: 0.8 % (ref 0.0–7.0)
Eosinophils Absolute: 0.1 10*3/uL (ref 0.0–0.5)
HEMATOCRIT: 31.1 % — AB (ref 38.4–49.9)
HGB: 9.9 g/dL — ABNORMAL LOW (ref 13.0–17.1)
LYMPH#: 2 10*3/uL (ref 0.9–3.3)
LYMPH%: 32.7 % (ref 14.0–49.0)
MCH: 29.7 pg (ref 27.2–33.4)
MCHC: 31.8 g/dL — ABNORMAL LOW (ref 32.0–36.0)
MCV: 93.4 fL (ref 79.3–98.0)
MONO#: 2.5 10*3/uL — AB (ref 0.1–0.9)
MONO%: 39.8 % — ABNORMAL HIGH (ref 0.0–14.0)
NEUT%: 24.3 % — ABNORMAL LOW (ref 39.0–75.0)
NEUTROS ABS: 1.5 10*3/uL (ref 1.5–6.5)
PLATELETS: 122 10*3/uL — AB (ref 140–400)
RBC: 3.33 10*6/uL — ABNORMAL LOW (ref 4.20–5.82)
RDW: 15.1 % — AB (ref 11.0–14.6)
WBC: 6.2 10*3/uL (ref 4.0–10.3)

## 2015-06-24 LAB — TECHNOLOGIST REVIEW

## 2015-06-24 MED ORDER — ACETAMINOPHEN 325 MG PO TABS
650.0000 mg | ORAL_TABLET | Freq: Once | ORAL | Status: AC
  Administered 2015-06-24: 650 mg via ORAL

## 2015-06-24 MED ORDER — ACETAMINOPHEN 325 MG PO TABS
ORAL_TABLET | ORAL | Status: AC
  Filled 2015-06-24: qty 2

## 2015-06-24 NOTE — Assessment & Plan Note (Signed)
Per discussion yesterday, I will complete staging with bone marrow aspirate and biopsy. He agreed to proceed.

## 2015-06-24 NOTE — Progress Notes (Signed)
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 pylori positive. Colonoscopy showed angiodysplastic lesions and polyps along with diverticula   03/09/2015 Pathology Results  gastric biopsy showed stomach MALT   04/28/2015 Miscellaneous  urea breath test was negative for H. pylori. Iron studies were within normal limits   05/21/2015 Procedure  EGD is normal. Random biopsy was negative   05/21/2015 Pathology Results  biopsies suspicious for residual lymphoma   06/22/2015 Imaging PET CT scan show abnormal metabolic activity in the stomach region, suspicious for residual lymphoma    INTERVAL HISTORY: Please see below for problem oriented charting. He returns today for bone marrow aspirate and biopsy. He denies new symptoms.  REVIEW OF SYSTEMS:   Constitutional: Denies fevers, chills or abnormal weight loss Eyes: Denies blurriness of vision Ears, nose, mouth, throat, and face: Denies mucositis or sore throat Respiratory: Denies cough, dyspnea or wheezes Cardiovascular: Denies palpitation, chest discomfort or lower extremity swelling Gastrointestinal:  Denies nausea, heartburn or change in bowel habits Skin: Denies abnormal skin rashes Lymphatics: Denies new lymphadenopathy or easy bruising Neurological:Denies numbness, tingling or new weaknesses Behavioral/Psych: Mood is stable, no new changes  All other systems were reviewed with the patient and are negative.  I have reviewed the past medical history, past surgical history, social history and family history with the patient and they are unchanged from previous note.  ALLERGIES:  is allergic to codeine.  MEDICATIONS:  Current Outpatient Prescriptions  Medication Sig Dispense  Refill  . ibuprofen (ADVIL,MOTRIN) 200 MG tablet Take 400 mg by mouth every 6 (six) hours as needed for moderate pain.     No current facility-administered medications for this visit.   Facility-Administered Medications Ordered in Other Visits  Medication Dose Route Frequency Provider Last Rate Last Dose  . fludeoxyglucose F - 18 (FDG) injection 7.86 milli Curie  7.86 milli Curie Intravenous Once PRN Medication Radiologist, MD   7.86 milli Curie at 06/22/15 1448    PHYSICAL EXAMINATION: ECOG PERFORMANCE STATUS: 0 - Asymptomatic  Filed Vitals:   06/24/15 0737  BP: 131/57  Pulse: 58  Temp: 97.1 F (36.2 C)  Resp: 18   There were no vitals filed for this visit.  GENERAL:alert, no distress and comfortable SKIN: skin color, texture, turgor are normal, no rashes or significant lesions EYES: normal, Conjunctiva are pink and non-injected, sclera clear Musculoskeletal:no cyanosis of digits and no clubbing  NEURO: alert & oriented x 3 with fluent speech, no focal motor/sensory deficits  LABORATORY DATA:  I have reviewed the data as listed    Component Value Date/Time   NA 141 06/15/2015 1135   NA 141 02/22/2015 0001   K 4.5 06/15/2015 1135   K 4.5 02/22/2015 0001   CL 104 02/22/2015 0001   CO2 26 06/15/2015 1135   CO2 26 02/22/2015 0001   GLUCOSE 106 06/15/2015 1135   GLUCOSE 86 02/22/2015 0001   BUN 14.0 06/15/2015 1135   BUN 17 02/22/2015 0001   CREATININE 1.0 06/15/2015 1135   CREATININE 1.03 02/22/2015 0001   CALCIUM 8.9 06/15/2015 1135   CALCIUM 8.4 02/22/2015 0001   PROT 6.7 06/15/2015 1135   PROT 6.2 02/22/2015 0001   ALBUMIN 3.8 06/15/2015 1135   ALBUMIN 3.8 02/22/2015 0001  AST 15 06/15/2015 1135   AST 14 02/22/2015 0001   ALT <9 06/15/2015 1135   ALT 8 02/22/2015 0001   ALKPHOS 66 06/15/2015 1135   ALKPHOS 51 02/22/2015 0001   BILITOT 0.34 06/15/2015 1135   BILITOT 0.5 02/22/2015 0001    No results found for: SPEP, UPEP  Lab Results  Component Value  Date   WBC 5.9 06/15/2015   NEUTROABS 1.4* 06/15/2015   HGB 9.4* 06/15/2015   HCT 29.1* 06/15/2015   MCV 92.7 06/15/2015   PLT 122* 06/15/2015      Chemistry      Component Value Date/Time   NA 141 06/15/2015 1135   NA 141 02/22/2015 0001   K 4.5 06/15/2015 1135   K 4.5 02/22/2015 0001   CL 104 02/22/2015 0001   CO2 26 06/15/2015 1135   CO2 26 02/22/2015 0001   BUN 14.0 06/15/2015 1135   BUN 17 02/22/2015 0001   CREATININE 1.0 06/15/2015 1135   CREATININE 1.03 02/22/2015 0001      Component Value Date/Time   CALCIUM 8.9 06/15/2015 1135   CALCIUM 8.4 02/22/2015 0001   ALKPHOS 66 06/15/2015 1135   ALKPHOS 51 02/22/2015 0001   AST 15 06/15/2015 1135   AST 14 02/22/2015 0001   ALT <9 06/15/2015 1135   ALT 8 02/22/2015 0001   BILITOT 0.34 06/15/2015 1135   BILITOT 0.5 02/22/2015 0001       RADIOGRAPHIC STUDIES: I have personally reviewed the radiological images as listed and agreed with the findings in the report. Nm Pet Image Initial (pi) Skull Base To Thigh  06/22/2015  CLINICAL DATA:  Initial treatment strategy for gastric MALT lymphoma. EXAM: NUCLEAR MEDICINE PET SKULL BASE TO THIGH TECHNIQUE: 7.86 mCi F-18 FDG was injected intravenously. Full-ring PET imaging was performed from the skull base to thigh after the radiotracer. CT data was obtained and used for attenuation correction and anatomic localization. FASTING BLOOD GLUCOSE:  Value: 98 mg/dl COMPARISON:  None. FINDINGS: NECK No hypermetabolic cervical lymph nodes are identified.There is mildly prominent but symmetric hypermetabolic activity within the palatine tonsils (SUV max 6.9). Mildly prominent activity within the submandibular glands also appears symmetric and is likely physiologic. The parotid activity appears unremarkable. No focal lesions of the pharyngeal mucosal space identified. CHEST There are no hypermetabolic mediastinal, hilar or axillary lymph nodes. There is no abnormal pulmonary metabolic activity. 4  mm nodule in the superior segment of the right lower lobe is noted on image 22. No other pulmonary nodules identified. There is mild atherosclerosis of the aorta, great vessels and coronary arteries. ABDOMEN/PELVIS There is no hypermetabolic activity within the liver, adrenal glands, spleen or pancreas. There is no hypermetabolic nodal activity. No abnormal activity is seen within the gastrointestinal tract. Nonobstructing left renal calculi and mild aortoiliac atherosclerosis noted. SKELETON There is no hypermetabolic activity to suggest osseous metastatic disease. There are degenerative changes within the lower lumbar spine. IMPRESSION: 1. No definite abnormal metabolic activity to suggest active lymphoma. Specifically, no focal abnormal activity seen within the stomach. 2. Nonspecific symmetric activity within the palatine tonsils and submandibular glands, probably physiologic. 3. Nonspecific 4 mm nodule in the superior segment of the right lower lobe. 4. Atherosclerosis and left renal calculi noted. Electronically Signed   By: Richardean Sale M.D.   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 He agreed to proceed.  CMML (  chronic myelomonocytic leukemia) ([place]) He has mild pancytopenia with monocytosis. CMML is suspected. I need to exclude other form of myeloproliferative disorder. I will order bone marrow biopsy to be done. Bone Marrow Biopsy and Aspiration Procedure Note   Informed consent was obtained and potential risks including bleeding, infection and pain were reviewed with the patient. The patient's name, date of birth, identification, consent and allergies were verified prior to the start of procedure and time out was performed. The right posterior iliac crest was chosen as the site of biopsy.  The skin was prepped with Betadine solution.   8 cc of 1% lidocaine was used to  provide local anaesthesia.   10 cc of bone marrow aspirate was obtained followed by 1 inch biopsy.   The procedure was tolerated well and there were no complications.  The patient was stable at the end of the procedure.  Specimens sent for flow cytometry, cytogenetics and additional studies.   All questions were answered. The patient knows to call the clinic with any problems, questions or concerns. No barriers to learning was detected. I spent 25 minutes counseling the patient face to face. The total time spent in the appointment was 30 minutes and more than 50% was on counseling and review of test results     [facility], [place], MD 06/24/2015 8:28 AM

## 2015-06-24 NOTE — Progress Notes (Signed)
Dsg to right lower back CD&I; no drainage noted; VSS; Re-iterated to pt and wife dsg can be taken off this time tomorrow; call for any problems.  Pt and wife verbalized understanding of instructions.

## 2015-06-24 NOTE — Patient Instructions (Signed)
Bone Marrow Aspiration and Bone Marrow Biopsy Bone marrow aspiration and bone marrow biopsy are procedures that are done to diagnose blood disorders. You may also have one of these procedures to help diagnose infections or some types of cancer. Bone marrow is the soft tissue that is inside your bones. Blood cells are produced in bone marrow. For bone marrow aspiration, a sample of tissue in liquid form is removed from inside your bone. For a bone marrow biopsy, a small core of bone marrow tissue is removed. Then these samples are examined under a microscope or tested in a lab. You may need these procedures if you have an abnormal complete blood count (CBC). The aspiration or biopsy sample is usually taken from the top of your hip bone. Sometimes, an aspiration sample is taken from your chest bone (sternum). LET YOUR HEALTH CARE PROVIDER KNOW ABOUT:  Any allergies you have.  All medicines you are taking, including vitamins, herbs, eye drops, creams, and over-the-counter medicines.  Previous problems you or members of your family have had with the use of anesthetics.  Any blood disorders you have.  Previous surgeries you have had.  Any medical conditions you may have.  Whether you are pregnant or you think that you may be pregnant. RISKS AND COMPLICATIONS Generally, this is a safe procedure. However, problems may occur, including:  Infection.  Bleeding. BEFORE THE PROCEDURE  Ask your health care provider about:  Changing or stopping your regular medicines. This is especially important if you are taking diabetes medicines or blood thinners.  Taking medicines such as aspirin and ibuprofen. These medicines can thin your blood. Do not take these medicines before your procedure if your health care provider instructs you not to.  Plan to have someone take you home after the procedure.  If you go home right after the procedure, plan to have someone with you for 24 hours. PROCEDURE   An  IV tube may be inserted into one of your veins.  The injection site will be cleaned with a germ-killing solution (antiseptic).  You will be given one or more of the following:  A medicine that helps you relax (sedative).  A medicine that numbs the area (local anesthetic).  The bone marrow sample will be removed as follows:  For an aspiration, a hollow needle will be inserted through your skin and into your bone. Bone marrow fluid will be drawn up into a syringe.  For a biopsy, your health care provider will use a hollow needle to remove a core of tissue from your bone marrow.  The needle will be removed.  A bandage (dressing) will be placed over the insertion site and taped in place. The procedure may vary among health care providers and hospitals. AFTER THE PROCEDURE  Your blood pressure, heart rate, breathing rate, and blood oxygen level will be monitored often until the medicines you were given have worn off.  Return to your normal activities as directed by your health care provider.   This information is not intended to replace advice given to you by your health care provider. Make sure you discuss any questions you have with your health care provider.   Document Released: 08/24/2004 Document Revised: 01/05/2015 Document Reviewed: 08/12/2014 Elsevier Interactive Patient Education 2016 [employer] Inc[employer]  

## 2015-06-24 NOTE — Assessment & Plan Note (Signed)
He has mild pancytopenia with monocytosis. CMML is suspected. I need to exclude other form of myeloproliferative disorder. I will order bone marrow biopsy to be done.

## 2015-06-25 LAB — BONE MARROW EXAM

## 2015-06-28 ENCOUNTER — Other Ambulatory Visit: Payer: Commercial Managed Care - HMO

## 2015-06-28 ENCOUNTER — Ambulatory Visit (HOSPITAL_COMMUNITY): Payer: Commercial Managed Care - HMO

## 2015-06-30 LAB — TISSUE HYBRIDIZATION (BONE MARROW)-NCBH

## 2015-06-30 LAB — CHROMOSOME ANALYSIS, BONE MARROW

## 2015-07-05 ENCOUNTER — Telehealth: Payer: Self-pay | Admitting: Hematology and Oncology

## 2015-07-05 ENCOUNTER — Telehealth: Payer: Self-pay | Admitting: *Deleted

## 2015-07-05 ENCOUNTER — Ambulatory Visit: Payer: Commercial Managed Care - HMO | Admitting: Hematology

## 2015-07-05 ENCOUNTER — Encounter: Payer: Self-pay | Admitting: Hematology and Oncology

## 2015-07-05 ENCOUNTER — Ambulatory Visit (HOSPITAL_BASED_OUTPATIENT_CLINIC_OR_DEPARTMENT_OTHER): Payer: Commercial Managed Care - HMO | Admitting: Hematology and Oncology

## 2015-07-05 VITALS — BP 126/62 | HR 59 | Temp 97.9°F | Resp 18 | Ht 68.0 in | Wt 159.8 lb

## 2015-07-05 DIAGNOSIS — C884 Extranodal marginal zone B-cell lymphoma of mucosa-associated lymphoid tissue [MALT-lymphoma]: Secondary | ICD-10-CM

## 2015-07-05 DIAGNOSIS — C9310 Chronic myelomonocytic leukemia not having achieved remission: Secondary | ICD-10-CM

## 2015-07-05 NOTE — Assessment & Plan Note (Signed)
I reviewed the report of the bone marrow biopsy and other information with the patient and his wife. At the time of discussion, cytogenetics results were not available but it was available at the time of dictation. His calculated revised international prognostic score would put him at high risk due to high blasts counts, anemia and risk cytogenetics. His median overall prognosis would put him at about 1.6 years with 25% risk of malignant transformation to AML also with an 1.4 years. I recommend referral to [facility] to see if he would be a candidate for intensive chemotherapy. If he is not a candidate or if the patient declines aggressive treatment, which are potentially offer him palliative chemotherapy or supportive care treatment only.

## 2015-07-05 NOTE — Assessment & Plan Note (Signed)
He is not symptomatic. I will defer treatment due to diagnosis of high-risk MDS above

## 2015-07-05 NOTE — Telephone Encounter (Addendum)
Pt referred to [facility] for MDS second opinion. Dr Joan Mayans 11/7 @ 0915  LM for patient to call for confirmation of appt and to discuss.

## 2015-07-05 NOTE — Progress Notes (Signed)
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 pylori positive. Colonoscopy showed angiodysplastic lesions and polyps along with diverticula   03/09/2015 Pathology Results  gastric biopsy showed stomach MALT   04/28/2015 Miscellaneous  urea breath test was negative for H. pylori. Iron studies were within normal limits   05/21/2015 Procedure  EGD is normal. Random biopsy was negative   05/21/2015 Pathology Results  biopsies suspicious for residual lymphoma   06/22/2015 Imaging PET CT scan show abnormal metabolic activity in the stomach region, suspicious for residual lymphoma    CMML (chronic myelomonocytic leukemia) ([place])   06/24/2015 Bone Marrow Biopsy Accession: [id number] BM biopsy was hypercellular with features of MPN & excess blasts.    06/24/2015 Tumor Marker Peripheral blood for Jak2 is negative   06/24/2015 Pathology Results Final pathology confirmed CMML-2, normal cytogenetics, CHIC2 neg, MDS panel neg, BCR/ABL neg    INTERVAL HISTORY: Please see below for problem oriented charting. He returns for further follow-up. He feels well.  REVIEW OF SYSTEMS:   Constitutional: Denies fevers, chills or abnormal weight loss Eyes: Denies blurriness of vision Ears, nose, mouth, throat, and face: Denies mucositis or sore throat Respiratory: Denies cough, dyspnea or wheezes Cardiovascular: Denies palpitation, chest discomfort or lower extremity swelling Gastrointestinal:  Denies nausea, heartburn or change in bowel habits Skin: Denies abnormal skin rashes Lymphatics: Denies new  lymphadenopathy or easy bruising Neurological:Denies numbness, tingling or new weaknesses Behavioral/Psych: Mood is stable, no new changes  All other systems were reviewed with the patient and are negative.  I have reviewed the past medical history, past surgical history, social history and family history with the patient and they are unchanged from previous note.  ALLERGIES:  is allergic to codeine.  MEDICATIONS:  Current Outpatient Prescriptions  Medication Sig Dispense Refill  . ibuprofen (ADVIL,MOTRIN) 200 MG tablet Take 400 mg by mouth every 6 (six) hours as needed for moderate pain.     No current facility-administered medications for this visit.    PHYSICAL EXAMINATION: ECOG PERFORMANCE STATUS: 0 - Asymptomatic  Filed Vitals:   07/05/15 0835  BP: 126/62  Pulse: 59  Temp: 97.9 F (36.6 C)  Resp: 18   Filed Weights   07/05/15 0835  Weight: 159 lb 12.8 oz (72.485 kg)    GENERAL:alert, no distress and comfortable SKIN: skin color, texture, turgor are normal, no rashes or significant lesions EYES: normal, Conjunctiva are pink and non-injected, sclera clear Musculoskeletal:no cyanosis of digits and no clubbing  NEURO: alert & oriented x 3 with fluent speech, no focal motor/sensory deficits  LABORATORY DATA:  I have reviewed the data as listed    Component Value Date/Time   NA 141 06/15/2015 1135   NA 141 02/22/2015 0001   K 4.5 06/15/2015 1135   K 4.5 02/22/2015 0001   CL 104 02/22/2015 0001   CO2 26 06/15/2015 1135   CO2 26 02/22/2015 0001   GLUCOSE 106 06/15/2015 1135   GLUCOSE 86 02/22/2015 0001   BUN 14.0 06/15/2015 1135   BUN 17 02/22/2015 0001   CREATININE 1.0  06/15/2015 1135   CREATININE 1.03 02/22/2015 0001   CALCIUM 8.9 06/15/2015 1135   CALCIUM 8.4 02/22/2015 0001   PROT 6.7 06/15/2015 1135   PROT 6.2 02/22/2015 0001   ALBUMIN 3.8 06/15/2015 1135   ALBUMIN 3.8 02/22/2015 0001   AST 15 06/15/2015 1135   AST 14 02/22/2015 0001   ALT <9  06/15/2015 1135   ALT 8 02/22/2015 0001   ALKPHOS 66 06/15/2015 1135   ALKPHOS 51 02/22/2015 0001   BILITOT 0.34 06/15/2015 1135   BILITOT 0.5 02/22/2015 0001    No results found for: SPEP, UPEP  Lab Results  Component Value Date   WBC 6.2 06/24/2015   NEUTROABS 1.5 06/24/2015   HGB 9.9* 06/24/2015   HCT 31.1* 06/24/2015   MCV 93.4 06/24/2015   PLT 122* 06/24/2015      Chemistry      Component Value Date/Time   NA 141 06/15/2015 1135   NA 141 02/22/2015 0001   K 4.5 06/15/2015 1135   K 4.5 02/22/2015 0001   CL 104 02/22/2015 0001   CO2 26 06/15/2015 1135   CO2 26 02/22/2015 0001   BUN 14.0 06/15/2015 1135   BUN 17 02/22/2015 0001   CREATININE 1.0 06/15/2015 1135   CREATININE 1.03 02/22/2015 0001      Component Value Date/Time   CALCIUM 8.9 06/15/2015 1135   CALCIUM 8.4 02/22/2015 0001   ALKPHOS 66 06/15/2015 1135   ALKPHOS 51 02/22/2015 0001   AST 15 06/15/2015 1135   AST 14 02/22/2015 0001   ALT <9 06/15/2015 1135   ALT 8 02/22/2015 0001   BILITOT 0.34 06/15/2015 1135   BILITOT 0.5 02/22/2015 0001      ASSESSMENT & PLAN:  CMML (chronic myelomonocytic leukemia) ([place]) I reviewed the report of the bone marrow biopsy and other information with the patient and his wife. At the time of discussion, cytogenetics results were not available but it was available at the time of dictation. His calculated revised international prognostic score would put him at high risk due to high blasts counts, anemia and risk cytogenetics. His median overall prognosis would put him at about 1.6 years with 25% risk of malignant transformation to AML also with an 1.4 years. I recommend referral to [facility] to see if he would be a candidate for intensive chemotherapy. If he is not a candidate or if the patient declines aggressive treatment, which are potentially offer him palliative chemotherapy or supportive care treatment only.  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 I will defer treatment due to diagnosis of high-risk MDS above   No orders of the defined types were placed in this encounter.   All questions were answered. The patient knows to call the clinic with any problems, questions or concerns. No barriers to learning was detected. I spent 25 minutes counseling the patient face to face. The total time spent in the appointment was 40 minutes and more than 50% was on counseling and review of test results     [facility], [place], MD 07/05/2015 9:47 AM

## 2015-07-05 NOTE — Telephone Encounter (Signed)
H[facility] to fax records

## 2015-07-05 NOTE — Telephone Encounter (Signed)
Faxed pt medical records to [facility]. Slides and scans will be fedex'ed °

## 2015-07-12 DIAGNOSIS — I7090 Unspecified atherosclerosis: Secondary | ICD-10-CM | POA: Diagnosis not present

## 2015-07-12 DIAGNOSIS — Z79899 Other long term (current) drug therapy: Secondary | ICD-10-CM | POA: Diagnosis not present

## 2015-07-12 DIAGNOSIS — D649 Anemia, unspecified: Secondary | ICD-10-CM | POA: Diagnosis not present

## 2015-07-12 DIAGNOSIS — Z5181 Encounter for therapeutic drug level monitoring: Secondary | ICD-10-CM | POA: Diagnosis not present

## 2015-07-12 DIAGNOSIS — Z8579 Personal history of other malignant neoplasms of lymphoid, hematopoietic and related tissues: Secondary | ICD-10-CM | POA: Diagnosis not present

## 2015-07-12 DIAGNOSIS — N200 Calculus of kidney: Secondary | ICD-10-CM | POA: Diagnosis not present

## 2015-07-12 DIAGNOSIS — R911 Solitary pulmonary nodule: Secondary | ICD-10-CM | POA: Diagnosis not present

## 2015-07-12 DIAGNOSIS — C884 Extranodal marginal zone B-cell lymphoma of mucosa-associated lymphoid tissue [MALT-lymphoma]: Secondary | ICD-10-CM | POA: Diagnosis not present

## 2015-07-12 DIAGNOSIS — C9310 Chronic myelomonocytic leukemia not having achieved remission: Secondary | ICD-10-CM | POA: Diagnosis not present

## 2015-07-20 DIAGNOSIS — K2950 Unspecified chronic gastritis without bleeding: Secondary | ICD-10-CM | POA: Diagnosis not present

## 2015-07-20 DIAGNOSIS — D469 Myelodysplastic syndrome, unspecified: Secondary | ICD-10-CM | POA: Diagnosis not present

## 2015-07-20 DIAGNOSIS — C9210 Chronic myeloid leukemia, BCR/ABL-positive, not having achieved remission: Secondary | ICD-10-CM | POA: Diagnosis not present

## 2015-07-23 ENCOUNTER — Encounter (HOSPITAL_COMMUNITY): Payer: Self-pay

## 2015-07-28 DIAGNOSIS — C9310 Chronic myelomonocytic leukemia not having achieved remission: Secondary | ICD-10-CM | POA: Diagnosis not present

## 2015-07-28 DIAGNOSIS — C9210 Chronic myeloid leukemia, BCR/ABL-positive, not having achieved remission: Secondary | ICD-10-CM | POA: Diagnosis not present

## 2015-09-16 DIAGNOSIS — C9310 Chronic myelomonocytic leukemia not having achieved remission: Secondary | ICD-10-CM | POA: Diagnosis not present

## 2015-10-28 DIAGNOSIS — C9310 Chronic myelomonocytic leukemia not having achieved remission: Secondary | ICD-10-CM | POA: Diagnosis not present

## 2015-10-28 DIAGNOSIS — F17290 Nicotine dependence, other tobacco product, uncomplicated: Secondary | ICD-10-CM | POA: Diagnosis not present

## 2015-10-28 DIAGNOSIS — C884 Extranodal marginal zone B-cell lymphoma of mucosa-associated lymphoid tissue [MALT-lymphoma]: Secondary | ICD-10-CM | POA: Diagnosis not present

## 2015-10-28 DIAGNOSIS — Z885 Allergy status to narcotic agent status: Secondary | ICD-10-CM | POA: Diagnosis not present

## 2015-11-11 DIAGNOSIS — C9310 Chronic myelomonocytic leukemia not having achieved remission: Secondary | ICD-10-CM | POA: Diagnosis not present

## 2015-11-11 DIAGNOSIS — Z86010 Personal history of colonic polyps: Secondary | ICD-10-CM | POA: Diagnosis not present

## 2015-11-11 DIAGNOSIS — C884 Extranodal marginal zone B-cell lymphoma of mucosa-associated lymphoid tissue [MALT-lymphoma]: Secondary | ICD-10-CM | POA: Diagnosis not present

## 2015-11-11 DIAGNOSIS — K319 Disease of stomach and duodenum, unspecified: Secondary | ICD-10-CM | POA: Diagnosis not present

## 2015-11-11 DIAGNOSIS — Z7982 Long term (current) use of aspirin: Secondary | ICD-10-CM | POA: Diagnosis not present

## 2015-11-11 DIAGNOSIS — F17200 Nicotine dependence, unspecified, uncomplicated: Secondary | ICD-10-CM | POA: Diagnosis not present

## 2015-11-11 DIAGNOSIS — K3189 Other diseases of stomach and duodenum: Secondary | ICD-10-CM | POA: Diagnosis not present

## 2015-12-21 ENCOUNTER — Encounter: Payer: Self-pay | Admitting: Family Medicine

## 2015-12-23 DIAGNOSIS — Z7982 Long term (current) use of aspirin: Secondary | ICD-10-CM | POA: Diagnosis not present

## 2015-12-23 DIAGNOSIS — C884 Extranodal marginal zone B-cell lymphoma of mucosa-associated lymphoid tissue [MALT-lymphoma]: Secondary | ICD-10-CM | POA: Diagnosis not present

## 2015-12-23 DIAGNOSIS — C9310 Chronic myelomonocytic leukemia not having achieved remission: Secondary | ICD-10-CM | POA: Diagnosis not present

## 2016-02-03 DIAGNOSIS — C884 Extranodal marginal zone B-cell lymphoma of mucosa-associated lymphoid tissue [MALT-lymphoma]: Secondary | ICD-10-CM | POA: Diagnosis not present

## 2016-03-23 DIAGNOSIS — I493 Ventricular premature depolarization: Secondary | ICD-10-CM | POA: Diagnosis not present

## 2016-03-23 DIAGNOSIS — I491 Atrial premature depolarization: Secondary | ICD-10-CM | POA: Diagnosis not present

## 2016-03-23 DIAGNOSIS — I208 Other forms of angina pectoris: Secondary | ICD-10-CM | POA: Diagnosis not present

## 2016-03-23 DIAGNOSIS — C9310 Chronic myelomonocytic leukemia not having achieved remission: Secondary | ICD-10-CM | POA: Diagnosis not present

## 2016-05-04 DIAGNOSIS — C884 Extranodal marginal zone B-cell lymphoma of mucosa-associated lymphoid tissue [MALT-lymphoma]: Secondary | ICD-10-CM | POA: Diagnosis not present

## 2016-05-25 DIAGNOSIS — C9310 Chronic myelomonocytic leukemia not having achieved remission: Secondary | ICD-10-CM | POA: Diagnosis not present

## 2016-06-15 DIAGNOSIS — C9310 Chronic myelomonocytic leukemia not having achieved remission: Secondary | ICD-10-CM | POA: Diagnosis not present

## 2016-06-15 DIAGNOSIS — D649 Anemia, unspecified: Secondary | ICD-10-CM | POA: Diagnosis not present

## 2016-07-13 DIAGNOSIS — C9310 Chronic myelomonocytic leukemia not having achieved remission: Secondary | ICD-10-CM | POA: Diagnosis not present

## 2016-08-02 ENCOUNTER — Encounter: Payer: Self-pay | Admitting: Family Medicine

## 2016-08-02 ENCOUNTER — Ambulatory Visit (INDEPENDENT_AMBULATORY_CARE_PROVIDER_SITE_OTHER): Payer: Commercial Managed Care - HMO | Admitting: Family Medicine

## 2016-08-02 VITALS — BP 120/80 | HR 72 | Resp 18 | Ht 66.0 in | Wt 156.2 lb

## 2016-08-02 DIAGNOSIS — F959 Tic disorder, unspecified: Secondary | ICD-10-CM

## 2016-08-02 DIAGNOSIS — F17200 Nicotine dependence, unspecified, uncomplicated: Secondary | ICD-10-CM | POA: Diagnosis not present

## 2016-08-02 DIAGNOSIS — C9310 Chronic myelomonocytic leukemia not having achieved remission: Secondary | ICD-10-CM | POA: Diagnosis not present

## 2016-08-02 NOTE — Progress Notes (Signed)
Jonathan Green is a 75 y.o. male who presents for annual wellness visit and follow-up on chronic medical conditions.  He has the following concerns: He does have underlying CMML and is being followed very closely at [facility]. He is on no medications and apparently there is no medicine readily available for him. He has no other concerns or complaints other than a dry cough for a month. No associated shortness of breath, fever, chills, sore throat, earache, nausea, vomiting or diarrhea. He was weight is stable. He is having no reflux symptoms. He does occasionally has some chest discomfort and takes Tylenol for that. He continues to smoke and has no desire to quit. He does have an advanced directive. His sister is helping with his general health. Presently the only medication he is taking is Tylenol. Overall he seems to be doing fairly well.   Immunizations and Health Maintenance Immunization History  Administered Date(s) Administered  . Pneumococcal Conjugate-13 02/22/2015   Health Maintenance Due  Topic Date Due  . TETANUS/TDAP  02/13/1961  . COLONOSCOPY  02/14/1992  . ZOSTAVAX  02/13/2002  . PNA vac Low Risk Adult (2 of 2 - PPSV23) 02/22/2016  . INFLUENZA VACCINE  04/04/2016    Last colonoscopy: over 10 yrs Last PSA: over 1  Yr  Dentist: none Ophtho: none  Exercise:walking all day and working in the yard/barn   Other doctors caring for patient include: Dr Garnet Koyanagi   Advanced Directives:Living Will and POA per pt.     Depression screen:  See questionnaire below.     Depression screen [facility] 2/9 08/02/2016 02/22/2015  Decreased Interest 0 0  Down, Depressed, Hopeless 0 0  PHQ - 2 Score 0 0    Fall Screen: See Questionaire below.   Fall Risk  08/02/2016 02/22/2015  Falls in the past year? No No    ADL screen:  See questionnaire below.   Functional Status Survey: Is the patient deaf or have difficulty hearing?: Yes Does the patient have difficulty seeing, even when wearing  glasses/contacts?: No Does the patient have difficulty concentrating, remembering, or making decisions?: No Does the patient have difficulty walking or climbing stairs?: No Does the patient have difficulty dressing or bathing?: No Does the patient have difficulty doing errands alone such as visiting a doctor's office or shopping?: No   Review of Systems  Constitutional: -, -unexpected weight change, -anorexia, -fatigue Allergy: -sneezing, -itching, -congestion Dermatology: denies changing moles, rash, lumps ENT: -runny nose, -ear pain, -sore throat,  Cardiology:  -chest pain, -palpitations, -orthopnea,  hematology: -bleeding or bruising problems Musculoskeletal: -arthralgias, -myalgias, -joint swelling, -back pain, - Ophthalmology: -vision changes,  Urology: -dysuria, -difficulty urinating,  -urinary frequency, -urgency, incontinence Neurology: -, -numbness, , -memory loss, -falls, -dizziness    PHYSICAL EXAM:   General Appearance: Alert, cooperative, no distress, appears stated age right facial tic noted. Head: Normocephalic, without obvious abnormality, atraumatic Eyes: PERRL, conjunctiva/corneas clear, EOM's intact, fundi benign Ears: Normal TM's and external ear canals Nose: Nares normal, mucosa normal, no drainage or sinus   tenderness Throat: Lips, mucosa, and tongue normal; teeth and gums normal Neck: Supple, no lymphadenopathy, thyroid:no enlargement/tenderness/nodules; no carotid bruit or JVD Lungs: Clear to auscultation bilaterally without wheezes, rales or ronchi; respirations unlabored Heart: Regular rate and rhythm, S1 and S2 normal, no murmur, rub or gallop Abdomen: Soft, non-tender, nondistended, normoactive bowel sounds, no masses, no hepatosplenomegaly Extremities: No clubbing, cyanosis or edema Pulses: 2+ and symmetric all extremities Skin: Skin color, texture, turgor normal, no  rashes or lesions Lymph nodes: Cervical, supraclavicular, and axillary nodes  normal Neurologic: CNII-XII intact, normal strength, sensation and gait; reflexes 2+ and symmetric throughout   Psych: Normal mood, affect, hygiene and grooming Recent notes from [facility] were reviewed in the care everywhere tab. ASSESSMENT/PLAN: Facial tic  Current smoker on some days  Chronic myelomonocytic leukemia not having achieved remission ([place])  Smoker unmotivated to quit He is being followed closely at [facility] for his CMML and requires very little input from me. They know that they can call me for anything. Did recommend that they discussed getting immunizations and make sure the oncologist is okay with that.  Medicare Attestation I have personally reviewed: The patient's medical and social history Their use of alcohol, tobacco or illicit drugs Their current medications and supplements The patient's functional ability including ADLs,fall risks, home safety risks, cognitive, and hearing and visual impairment Diet and physical activities Evidence for depression or mood disorders  The patient's weight, height, and BMI have been recorded in the chart.  I have made referrals, counseling, and provided education to the patient based on review of the above and I have provided the patient with a written personalized care plan for preventive services.     Wyatt Haste, MD   08/02/2016

## 2016-08-02 NOTE — Progress Notes (Deleted)
Jonathan Green is a 75 y.o. male who presents for annual wellness visit and follow-up on chronic medical conditions.  He has the following concerns:   Immunizations and Health Maintenance Immunization History  Administered Date(s) Administered  . Pneumococcal Conjugate-13 02/22/2015   Health Maintenance Due  Topic Date Due  . TETANUS/TDAP  02/13/1961  . COLONOSCOPY  02/14/1992  . ZOSTAVAX  02/13/2002  . PNA vac Low Risk Adult (2 of 2 - PPSV23) 02/22/2016  . INFLUENZA VACCINE  04/04/2016    Last colonoscopy: over 10 years  Last PSA:  over 1 yr Dentist: none Ophtho: none Exercise: daily- always moving around   Other doctors caring for patient include: Dr. Dorna Bloom  Advanced Directives:  Yes- Living Will and POA      Depression screen:  See questionnaire below.     Depression screen PHQ 2/9 02/22/2015  Decreased Interest 0  Down, Depressed, Hopeless 0  PHQ - 2 Score 0    Fall Screen: See Questionaire below.   Fall Risk  02/22/2015  Falls in the past year? No    ADL screen:  See questionnaire below.  Functional Status Survey: no concerns except hearing     Review of Systems  Constitutional: -, -unexpected weight change, -anorexia, -fatigue Allergy: -sneezing, -itching, -congestion Dermatology: denies changing moles, rash, lumps ENT: -runny nose, -ear pain, -sore throat,  Cardiology:  -chest pain, -palpitations, -orthopnea, Respiratory: -cough, -shortness of breath, -dyspnea on exertion, -wheezing,  Gastroenterology: -abdominal pain, -nausea, -vomiting, -diarrhea, -constipation, -dysphagia Hematology: -bleeding or bruising problems Musculoskeletal: -arthralgias, -myalgias, -joint swelling, -back pain, - Ophthalmology: -vision changes,  Urology: -dysuria, -difficulty urinating,  -urinary frequency, -urgency, incontinence Neurology: -, -numbness, , -memory loss, -falls, -dizziness    PHYSICAL EXAM:  There were no vitals taken for this visit.  General  Appearance: Alert, cooperative, no distress, appears stated age Head: Normocephalic, without obvious abnormality, atraumatic Eyes: PERRL, conjunctiva/corneas clear, EOM's intact, fundi benign Ears: Normal TM's and external ear canals Nose: Nares normal, mucosa normal, no drainage or sinus   tenderness Throat: Lips, mucosa, and tongue normal; teeth and gums normal Neck: Supple, no lymphadenopathy, thyroid:no enlargement/tenderness/nodules; no carotid bruit or JVD Lungs: Clear to auscultation bilaterally without wheezes, rales or ronchi; respirations unlabored Heart: Regular rate and rhythm, S1 and S2 normal, no murmur, rub or gallop Abdomen: Soft, non-tender, nondistended, normoactive bowel sounds, no masses, no hepatosplenomegaly Extremities: No clubbing, cyanosis or edema Pulses: 2+ and symmetric all extremities Skin: Skin color, texture, turgor normal, no rashes or lesions Lymph nodes: Cervical, supraclavicular, and axillary nodes normal Neurologic: CNII-XII intact, normal strength, sensation and gait; reflexes 2+ and symmetric throughout   Psych: Normal mood, affect, hygiene and grooming  ASSESSMENT/PLAN:    Discussed PSA screening (risks/benefits), recommended at least 30 minutes of aerobic activity at least 5 days/week; proper sunscreen use reviewed; healthy diet and alcohol recommendations (less than or equal to 2 drinks/day) reviewed; regular seatbelt use; changing batteries in smoke detectors. Immunization recommendations discussed.  Colonoscopy recommendations reviewed.   Medicare Attestation I have personally reviewed: The patient's medical and social history Their use of alcohol, tobacco or illicit drugs Their current medications and supplements The patient's functional ability including ADLs,fall risks, home safety risks, cognitive, and hearing and visual impairment Diet and physical activities Evidence for depression or mood disorders  The patient's weight, height, and  BMI have been recorded in the chart.  I have made referrals, counseling, and provided education to the patient based on review of the above and  I have provided the patient with a written personalized care plan for preventive services.     Wyatt Haste, MD   08/02/2016

## 2016-08-17 DIAGNOSIS — C9310 Chronic myelomonocytic leukemia not having achieved remission: Secondary | ICD-10-CM | POA: Diagnosis not present

## 2016-08-21 ENCOUNTER — Encounter: Payer: Self-pay | Admitting: Family Medicine

## 2016-09-14 DIAGNOSIS — C9310 Chronic myelomonocytic leukemia not having achieved remission: Secondary | ICD-10-CM | POA: Diagnosis not present

## 2016-09-14 DIAGNOSIS — Z8572 Personal history of non-Hodgkin lymphomas: Secondary | ICD-10-CM | POA: Diagnosis not present

## 2016-09-14 DIAGNOSIS — D649 Anemia, unspecified: Secondary | ICD-10-CM | POA: Diagnosis not present

## 2016-09-14 DIAGNOSIS — R079 Chest pain, unspecified: Secondary | ICD-10-CM | POA: Diagnosis not present

## 2016-09-14 DIAGNOSIS — N289 Disorder of kidney and ureter, unspecified: Secondary | ICD-10-CM | POA: Diagnosis not present

## 2016-09-18 ENCOUNTER — Ambulatory Visit: Payer: Commercial Managed Care - HMO | Admitting: Family Medicine

## 2016-09-18 ENCOUNTER — Ambulatory Visit (INDEPENDENT_AMBULATORY_CARE_PROVIDER_SITE_OTHER): Payer: Medicare HMO | Admitting: Family Medicine

## 2016-09-18 ENCOUNTER — Encounter: Payer: Self-pay | Admitting: Family Medicine

## 2016-09-18 VITALS — BP 114/60 | HR 60 | Resp 14 | Wt 163.0 lb

## 2016-09-18 DIAGNOSIS — R42 Dizziness and giddiness: Secondary | ICD-10-CM | POA: Diagnosis not present

## 2016-09-18 DIAGNOSIS — R001 Bradycardia, unspecified: Secondary | ICD-10-CM | POA: Diagnosis not present

## 2016-09-18 DIAGNOSIS — I499 Cardiac arrhythmia, unspecified: Secondary | ICD-10-CM

## 2016-09-18 DIAGNOSIS — F959 Tic disorder, unspecified: Secondary | ICD-10-CM | POA: Diagnosis not present

## 2016-09-18 DIAGNOSIS — R079 Chest pain, unspecified: Secondary | ICD-10-CM

## 2016-09-18 DIAGNOSIS — I498 Other specified cardiac arrhythmias: Secondary | ICD-10-CM

## 2016-09-18 DIAGNOSIS — Z23 Encounter for immunization: Secondary | ICD-10-CM | POA: Diagnosis not present

## 2016-09-18 LAB — CBC WITH DIFFERENTIAL/PLATELET
BASOS ABS: 132 {cells}/uL (ref 0–200)
Basophils Relative: 2 %
EOS PCT: 1 %
Eosinophils Absolute: 66 cells/uL (ref 15–500)
HCT: 27.9 % — ABNORMAL LOW (ref 38.5–50.0)
Hemoglobin: 9.2 g/dL — ABNORMAL LOW (ref 13.2–17.1)
LYMPHS ABS: 1980 {cells}/uL (ref 850–3900)
LYMPHS PCT: 30 %
MCH: 29.1 pg (ref 27.0–33.0)
MCHC: 33 g/dL (ref 32.0–36.0)
MCV: 88.3 fL (ref 80.0–100.0)
MONOS PCT: 46 %
MPV: 12 fL (ref 7.5–12.5)
Monocytes Absolute: 3036 cells/uL — ABNORMAL HIGH (ref 200–950)
NEUTROS PCT: 21 %
Neutro Abs: 1386 cells/uL — ABNORMAL LOW (ref 1500–7800)
Platelets: 106 10*3/uL — ABNORMAL LOW (ref 140–400)
RBC: 3.16 MIL/uL — ABNORMAL LOW (ref 4.20–5.80)
RDW: 15.6 % — ABNORMAL HIGH (ref 11.0–15.0)
WBC: 6.6 10*3/uL (ref 4.0–10.5)

## 2016-09-18 LAB — COMPREHENSIVE METABOLIC PANEL
ALBUMIN: 4.2 g/dL (ref 3.6–5.1)
ALT: 8 U/L — ABNORMAL LOW (ref 9–46)
AST: 13 U/L (ref 10–35)
Alkaline Phosphatase: 49 U/L (ref 40–115)
BUN: 19 mg/dL (ref 7–25)
CALCIUM: 8.9 mg/dL (ref 8.6–10.3)
CHLORIDE: 107 mmol/L (ref 98–110)
CO2: 29 mmol/L (ref 20–31)
Creat: 1.19 mg/dL — ABNORMAL HIGH (ref 0.70–1.18)
Glucose, Bld: 97 mg/dL (ref 65–99)
Potassium: 4.3 mmol/L (ref 3.5–5.3)
Sodium: 140 mmol/L (ref 135–146)
Total Bilirubin: 0.4 mg/dL (ref 0.2–1.2)
Total Protein: 6.8 g/dL (ref 6.1–8.1)

## 2016-09-18 LAB — TSH: TSH: 3.72 mIU/L (ref 0.40–4.50)

## 2016-09-18 LAB — LIPID PANEL
Cholesterol: 105 mg/dL (ref <200)
HDL: 29 mg/dL — ABNORMAL LOW (ref >40)
LDL Cholesterol: 63 mg/dL (ref <100)
Total CHOL/HDL Ratio: 3.6 Ratio (ref <5.0)
Triglycerides: 63 mg/dL (ref <150)
VLDL: 13 mg/dL (ref <30)

## 2016-09-18 NOTE — Progress Notes (Signed)
   Subjective:    Patient ID: Jonathan Green, male    DOB: [birth year], 76 y.o.   MRN: [id number]  HPI He is here for consult concerning dizziness that he has had for the last 2 months. It can occur at any time including lying down, moving or sitting. There is no associated chest pain, shortness of breath, heart rate changes, blurred or double vision but he does note a sensation of flashing lights in the periphery. He also complains of chest pain that can occur less than once per week. It starts in the mid chest area with radiation up into the right upper chest area but no associated shortness of breath, diaphoresis, weakness He continues to be followed at [facility] for his lymphoma.  Review of Systems     Objective:   Physical Exam Alert and in no distress.Right-sided facial tic again noted Tympanic membranes and canals are normal. Pharyngeal area is normal. Neck is supple without adenopathy or thyromegaly. Cardiac exam shows a slow bigeminal rhythm without murmurs or gallops. Lungs are clear to auscultation. DTRs diminished. EKG shows bradycardia with a bigeminal rhythm      Assessment & Plan:  Need for prophylactic vaccination and inoculation against influenza - Plan: Flu vaccine HIGH DOSE PF (Fluzone High dose)  Cardiac arrhythmia, unspecified cardiac arrhythmia type - Plan: EKG 12-Lead, Ambulatory referral to Cardiology, CBC with Differential/Platelet, Comprehensive metabolic panel, Lipid panel, TSH  Dizziness - Plan: CBC with Differential/Platelet, Comprehensive metabolic panel, Lipid panel, TSH  Chest pain, unspecified type - Plan: CBC with Differential/Platelet, Comprehensive metabolic panel, Lipid panel, TSH  Bradycardia - Plan: EKG 12-Lead, CBC with Differential/Platelet, Comprehensive metabolic panel, Lipid panel, TSH  Bigeminy - Plan: EKG 12-Lead, CBC with Differential/Platelet, Comprehensive metabolic panel, Lipid panel, TSH  Facial tic  Although he doesn't give a good  history with the dizziness and heart rate have a feeling that that's what's causing the dizziness. Listening to his heart rate he would go in and out of a bigeminal rhythm. Also encouraged him to make sure he maintains good fluid status.

## 2016-09-19 LAB — PATHOLOGIST SMEAR REVIEW

## 2016-09-26 ENCOUNTER — Encounter: Payer: Self-pay | Admitting: Cardiovascular Disease

## 2016-09-26 ENCOUNTER — Encounter (INDEPENDENT_AMBULATORY_CARE_PROVIDER_SITE_OTHER): Payer: Self-pay

## 2016-09-26 ENCOUNTER — Encounter (INDEPENDENT_AMBULATORY_CARE_PROVIDER_SITE_OTHER): Payer: Medicare HMO

## 2016-09-26 ENCOUNTER — Ambulatory Visit (INDEPENDENT_AMBULATORY_CARE_PROVIDER_SITE_OTHER): Payer: Medicare HMO | Admitting: Cardiovascular Disease

## 2016-09-26 VITALS — BP 122/60 | HR 70 | Ht 66.0 in | Wt 165.0 lb

## 2016-09-26 DIAGNOSIS — R079 Chest pain, unspecified: Secondary | ICD-10-CM

## 2016-09-26 DIAGNOSIS — R42 Dizziness and giddiness: Secondary | ICD-10-CM

## 2016-09-26 DIAGNOSIS — I493 Ventricular premature depolarization: Secondary | ICD-10-CM | POA: Diagnosis not present

## 2016-09-26 NOTE — Assessment & Plan Note (Signed)
Jonathan Green has recently complained of dizziness. This can occur while standing or sitting. He has not lost consciousness. An EKG performed at his PCPs office showed sinus rhythm with bigeminal PVCs and an effective ventricular response of 53. He is on no medications. I'm going to obtain a 2-D echocardiogram and a 30 day event monitor.

## 2016-09-26 NOTE — Patient Instructions (Signed)
Medication Instructions: Your physician recommends that you continue on your current medications as directed. Please refer to the Current Medication list given to you today.   Testing/Procedures: Your physician has requested that you have an echocardiogram. Echocardiography is a painless test that uses sound waves to create images of your heart. It provides your doctor with information about the size and shape of your heart and how well your heart's chambers and valves are working. This procedure takes approximately one hour. There are no restrictions for this procedure.  Your physician has requested that you have a lexiscan myoview. For further information please visit [email or web address]. Please follow instruction sheet, as given.  Your physician has recommended that you wear a 30 day event monitor. Event monitors are medical devices that record the heart's electrical activity. Doctors most often [place] these monitors to diagnose arrhythmias. Arrhythmias are problems with the speed or rhythm of the heartbeat. The monitor is a small, portable device. You can wear one while you do your normal daily activities. This is usually used to diagnose what is causing palpitations/syncope (passing out).  Follow-Up: Your physician recommends that you schedule a follow-up appointment after testing is complete.   Any Other Special Instructions will be listed below:  If you need a refill on your cardiac medications before your next appointment, please call your pharmacy.  Pharmacologic Stress Electrocardiogram A pharmacologic stress electrocardiogram is a heart (cardiac) test that uses nuclear imaging to evaluate the blood supply to your heart. This test may also be called a pharmacologic stress electrocardiography. Pharmacologic means that a medicine is used to increase your heart rate and blood pressure.  This stress test is done to find areas of poor blood flow to the heart by determining the extent of  coronary artery disease (CAD). Some people exercise on a treadmill, which naturally increases the blood flow to the heart. For those people unable to exercise on a treadmill, a medicine is used. This medicine stimulates your heart and will cause your heart to beat harder and more quickly, as if you were exercising.  Pharmacologic stress tests can help determine:  The adequacy of blood flow to your heart during increased levels of activity in order to clear you for discharge home.  The extent of coronary artery blockage caused by CAD.  Your prognosis if you have suffered a heart attack.  The effectiveness of cardiac procedures done, such as an angioplasty, which can increase the circulation in your coronary arteries.  Causes of chest pain or pressure. LET [facility] CARE PROVIDER KNOW ABOUT:  Any allergies you have.  All medicines you are taking, including vitamins, herbs, eye drops, creams, and over-the-counter medicines.  Previous problems you or members of your family have had with the use of anesthetics.  Any blood disorders you have.  Previous surgeries you have had.  Medical conditions you have.  Possibility of pregnancy, if this applies.  If you are currently breastfeeding. RISKS AND COMPLICATIONS Generally, this is a safe procedure. However, as with any procedure, complications can occur. Possible complications include:  You develop pain or pressure in the following areas:  Chest.  Jaw or neck.  Between your shoulder blades.  Radiating down your left arm.  Headache.  Dizziness or light-headedness.  Shortness of breath.  Increased or irregular heartbeat.  Low blood pressure.  Nausea or vomiting.  Flushing.  Redness going up the arm and slight pain during injection of medicine.  Heart attack (rare). BEFORE THE PROCEDURE   Avoid all forms  of caffeine for 24 hours before your test or as directed by your health care provider. This includes coffee, tea  (even decaffeinated tea), caffeinated sodas, chocolate, cocoa, and certain pain medicines.  Follow your health care provider's instructions regarding eating and drinking before the test.  Take your medicines as directed at regular times with water unless instructed otherwise. Exceptions may include:  If you have diabetes, ask how you are to take your insulin or pills. It is common to adjust insulin dosing the morning of the test.  If you are taking beta-blocker medicines, it is important to talk to your health care provider about these medicines well before the date of your test. Taking beta-blocker medicines may interfere with the test. In some cases, these medicines need to be changed or stopped 24 hours or more before the test.  If you wear a nitroglycerin patch, it may need to be removed prior to the test. Ask your health care provider if the patch should be removed before the test.  If you use an inhaler for any breathing condition, bring it with you to the test.  If you are an outpatient, bring a snack so you can eat right after the stress phase of the test.  Do not smoke for 4 hours prior to the test or as directed by your health care provider.  Do not apply lotions, powders, creams, or oils on your chest prior to the test.  Wear comfortable shoes and clothing. Let your health care provider know if you were unable to complete or follow the preparations for your test. PROCEDURE   Multiple patches (electrodes) will be put on your chest. If needed, small areas of your chest may be shaved to get better contact with the electrodes. Once the electrodes are attached to your body, multiple wires will be attached to the electrodes, and your heart rate will be monitored.  An IV access will be started. A nuclear trace (isotope) is given. The isotope may be given intravenously, or it may be swallowed. Nuclear refers to several types of radioactive isotopes, and the nuclear isotope lights up the  arteries so that the nuclear images are clear. The isotope is absorbed by your body. This results in low radiation exposure.  A resting nuclear image is taken to show how your heart functions at rest.  A medicine is given through the IV access.  A second scan is done about 1 hour after the medicine injection and determines how your heart functions under stress.  During this stress phase, you will be connected to an electrocardiogram machine. Your blood pressure and oxygen levels will be monitored. AFTER THE PROCEDURE   Your heart rate and blood pressure will be monitored after the test.  You may return to your normal schedule, including diet,activities, and medicines, unless your health care provider tells you otherwise. This information is not intended to replace advice given to you by your health care provider. Make sure you discuss any questions you have with your health care provider. Document Released: 01/07/2009 Document Revised: 08/26/2013 Document Reviewed: 04/28/2013 Elsevier Interactive Patient Education  2017 [employer].    Cardiac Event Monitoring A cardiac event monitor is a small recording device used to help detect abnormal heart rhythms (arrhythmias). The monitor is used to record heart rhythm when noticeable symptoms such as the following occur:  Fast heartbeats (palpitations), such as heart racing or fluttering.  Dizziness.  Fainting or light-headedness.  Unexplained weakness. The monitor is wired to two electrodes  placed on your chest. Electrodes are flat, sticky disks that attach to your skin. The monitor can be worn for up to 30 days. You will wear the monitor at all times, except when bathing. How to use your cardiac event monitor A technician will prepare your chest for the electrode placement. The technician will show you how to place the electrodes, how to work the monitor, and how to replace the batteries. Take time to practice using the monitor before  you leave the office. Make sure you understand how to send the information from the monitor to your health care provider. This requires a telephone with a landline, not a cell phone. You need to:  Wear your monitor at all times, except when you are in water:  Do not get the monitor wet.  Take the monitor off when bathing. Do not swim or use a hot tub with it on.  Keep your skin clean. Do not put body lotion or moisturizer on your chest.  Change the electrodes daily or any time they stop sticking to your skin. You might need to use tape to keep them on.  It is possible that your skin under the electrodes could become irritated. To keep this from happening, try to put the electrodes in slightly different places on your chest. However, they must remain in the area under your left breast and in the upper right section of your chest.  Make sure the monitor is safely clipped to your clothing or in a location close to your body that your health care provider recommends.  Press the button to record when you feel symptoms of heart trouble, such as dizziness, weakness, light-headedness, palpitations, thumping, shortness of breath, unexplained weakness, or a fluttering or racing heart. The monitor is always on and records what happened slightly before you pressed the button, so do not worry about being too late to get good information.  Keep a diary of your activities, such as walking, doing chores, and taking medicine. It is especially important to note what you were doing when you pushed the button to record your symptoms. This will help your health care provider determine what might be contributing to your symptoms. The information stored in your monitor will be reviewed by your health care provider alongside your diary entries.  Send the recorded information as recommended by your health care provider. It is important to understand that it will take some time for your health care provider to process the  results.  Change the batteries as recommended by your health care provider. Get help right away if:  You have chest pain.  You have extreme difficulty breathing or shortness of breath.  You develop a very fast heartbeat that persists.  You develop dizziness that does not go away.  You faint or constantly feel you are about to faint. This information is not intended to replace advice given to you by your health care provider. Make sure you discuss any questions you have with your health care provider. Document Released: 05/30/2008 Document Revised: 01/27/2016 Document Reviewed: 02/17/2013 Elsevier Interactive Patient Education  2017 [employer].

## 2016-09-26 NOTE — Assessment & Plan Note (Signed)
Recent onset of chest pain sounds suspicious for angina. Factors include tobacco abuse and strong family history. I'm going to obtain a pharmacologic Myoview stress test to further evaluate.

## 2016-09-26 NOTE — Progress Notes (Signed)
09/26/2016 Jonathan Green   [birth year]  [id number]  Primary Physician Wyatt Haste, MD Primary Cardiologist: Lorretta Harp MD Renae Gloss  HPI:  Mr Jonathan Green is a 76 year old thin-appearing married Caucasian male father of 9, grandfather and 3 grandchildren is accompanied by his wife Jonathan Green who is also a patient of mine. He was referred by his PCP, Dr. [name] School, for evaluation of dizziness, PVCs and chest pain. His risk factors include ongoing tobacco abuse of 10 6 cigars a day but otherwise only remarkable for family history with many brothers who have had stents. He has ever had a heart attack or stroke. He does have CMML  treated at [facility]. He has noticed some episodic dizziness recently. An EKG performed at his PCPs office showed sinus rhythm with bigeminal PVCs and an effective ventricular response of 53. He has also complained of several episodes of worrisome chest pain.   Current Outpatient Prescriptions  Medication Sig Dispense Refill  . acetaminophen (TYLENOL) 500 MG tablet Take 500 mg by mouth every 6 (six) hours as needed.     No current facility-administered medications for this visit.     Allergies  Allergen Reactions  . Codeine Nausea And Vomiting    Social History   Social History  . Marital status: Married    Spouse name: N/A  . Number of children: N/A  . Years of education: N/A   Occupational History  . retired Engineer, materials)    Social History Main Topics  . Smoking status: Current Some Day Smoker    Years: 50.00    Types: Cigars  . Smokeless tobacco: Never Used     Comment: averages 4 cigars per day  . Alcohol use No  . Drug use: No  . Sexual activity: Not Currently     Comment: retired, lives with wife   Other Topics Concern  . Not on file   Social History Narrative  . No narrative on file     Review of Systems: General: negative for chills, fever, night sweats or weight changes.    Cardiovascular: negative for chest pain, dyspnea on exertion, edema, orthopnea, palpitations, paroxysmal nocturnal dyspnea or shortness of breath Dermatological: negative for rash Respiratory: negative for cough or wheezing Urologic: negative for hematuria Abdominal: negative for nausea, vomiting, diarrhea, bright red blood per rectum, melena, or hematemesis Neurologic: negative for visual changes, syncope, or dizziness All other systems reviewed and are otherwise negative except as noted above.    Blood pressure 122/60, pulse 70, height 5\' 6"  (1.676 m), weight 165 lb (74.8 kg).  General appearance: alert and no distress Neck: no adenopathy, no carotid bruit, no JVD, supple, symmetrical, trachea midline and thyroid not enlarged, symmetric, no tenderness/mass/nodules Lungs: clear to auscultation bilaterally Heart: regular rate and rhythm, S1, S2 normal, no murmur, click, rub or gallop Extremities: extremities normal, atraumatic, no cyanosis or edema  EKG not performed today  ASSESSMENT AND PLAN:   Dizziness Mr. Kerin has recently complained of dizziness. This can occur while standing or sitting. He has not lost consciousness. An EKG performed at his PCPs office showed sinus rhythm with bigeminal PVCs and an effective ventricular response of 53. He is on no medications. I'm going to obtain a 2-D echocardiogram and a 30 day event monitor.  Chest pain Recent onset of chest pain sounds suspicious for angina. Factors include tobacco abuse and strong family history. I'm going to obtain a pharmacologic Myoview stress test to further evaluate.  Lorretta Harp MD FACP,FACC,FAHA, [facility] 09/26/2016 8:31 AM

## 2016-09-27 ENCOUNTER — Telehealth (HOSPITAL_COMMUNITY): Payer: Self-pay

## 2016-09-27 DIAGNOSIS — I493 Ventricular premature depolarization: Secondary | ICD-10-CM | POA: Diagnosis not present

## 2016-09-27 DIAGNOSIS — R42 Dizziness and giddiness: Secondary | ICD-10-CM | POA: Diagnosis not present

## 2016-09-27 DIAGNOSIS — R079 Chest pain, unspecified: Secondary | ICD-10-CM | POA: Diagnosis not present

## 2016-09-27 NOTE — Telephone Encounter (Signed)
Encounter complete. 

## 2016-09-28 ENCOUNTER — Ambulatory Visit (HOSPITAL_COMMUNITY)
Admission: RE | Admit: 2016-09-28 | Discharge: 2016-09-28 | Disposition: A | Discharge status: Home / Self Care | Payer: Medicare HMO | Source: Ambulatory Visit | Attending: Cardiovascular Disease | Admitting: Cardiovascular Disease

## 2016-09-28 DIAGNOSIS — R42 Dizziness and giddiness: Secondary | ICD-10-CM | POA: Insufficient documentation

## 2016-09-28 DIAGNOSIS — R079 Chest pain, unspecified: Secondary | ICD-10-CM | POA: Insufficient documentation

## 2016-09-28 MED ORDER — TECHNETIUM TC 99M TETROFOSMIN IV KIT
9.8000 | PACK | Freq: Once | INTRAVENOUS | Status: AC | PRN
Start: 2016-09-28 — End: 2016-09-28
  Administered 2016-09-28: 9.8 via INTRAVENOUS
  Filled 2016-09-28: qty 10

## 2016-09-28 MED ORDER — AMINOPHYLLINE 25 MG/ML IV SOLN
75.0000 mg | Freq: Once | INTRAVENOUS | Status: AC
Start: 2016-09-28 — End: 2016-09-28
  Administered 2016-09-28: 75 mg via INTRAVENOUS

## 2016-09-28 MED ORDER — TECHNETIUM TC 99M TETROFOSMIN IV KIT
31.2000 | PACK | Freq: Once | INTRAVENOUS | Status: AC | PRN
  Administered 2016-09-28: 31.2 via INTRAVENOUS
  Filled 2016-09-28: qty 32

## 2016-09-28 MED ORDER — REGADENOSON 0.4 MG/5ML IV SOLN
0.4000 mg | Freq: Once | INTRAVENOUS | Status: AC
Start: 2016-09-28 — End: 2016-09-28
  Administered 2016-09-28: 0.4 mg via INTRAVENOUS

## 2016-09-29 LAB — MYOCARDIAL PERFUSION IMAGING
CHL CUP NUCLEAR SSS: 6
CHL CUP RESTING HR STRESS: 60 {beats}/min
CSEPPHR: 93 {beats}/min
LV sys vol: 42 mL
LVDIAVOL: 97 mL (ref 62–150)
SDS: 4
SRS: 2
TID: 1.05

## 2016-10-03 NOTE — Addendum Note (Signed)
Addended by: Diana Eves on: 10/03/2016 10:43 AM   Modules accepted: Orders

## 2016-10-05 ENCOUNTER — Other Ambulatory Visit: Payer: Self-pay | Admitting: Cardiovascular Disease

## 2016-10-05 DIAGNOSIS — R42 Dizziness and giddiness: Secondary | ICD-10-CM

## 2016-10-05 DIAGNOSIS — R079 Chest pain, unspecified: Secondary | ICD-10-CM

## 2016-10-05 DIAGNOSIS — I493 Ventricular premature depolarization: Secondary | ICD-10-CM

## 2016-10-09 ENCOUNTER — Ambulatory Visit (INDEPENDENT_AMBULATORY_CARE_PROVIDER_SITE_OTHER): Payer: Medicare HMO | Admitting: Physician Assistant

## 2016-10-09 ENCOUNTER — Other Ambulatory Visit: Payer: Self-pay

## 2016-10-09 ENCOUNTER — Encounter: Payer: Self-pay | Admitting: Physician Assistant

## 2016-10-09 ENCOUNTER — Ambulatory Visit (HOSPITAL_COMMUNITY): Payer: Medicare HMO | Attending: Cardiology

## 2016-10-09 VITALS — BP 132/87 | HR 56

## 2016-10-09 DIAGNOSIS — R079 Chest pain, unspecified: Secondary | ICD-10-CM | POA: Diagnosis not present

## 2016-10-09 DIAGNOSIS — R21 Rash and other nonspecific skin eruption: Secondary | ICD-10-CM | POA: Diagnosis not present

## 2016-10-09 DIAGNOSIS — I340 Nonrheumatic mitral (valve) insufficiency: Secondary | ICD-10-CM | POA: Insufficient documentation

## 2016-10-09 NOTE — Progress Notes (Signed)
Cardiology Office Note   Date:  10/09/2016   ID:  Jonathan Green, DOB 08-29-1941, MRN [id number]  PCP:  Wyatt Haste, MD  Cardiologist:  Dr Gwenlyn Found 09/26/2016 Rosaria Ferries, PA-C   Chief Complaint  Patient presents with  . Rash    History of Present Illness: Jonathan Green is a 76 y.o. male with a history of CML, anemia, MALT, Tob use, Family history of CAD  01/23, seen by Dr. Gwenlyn Found for dizziness, PVCs and chest pain, monitor was scheduled.  Jonathan Green presents for evaluation of a rash.  He was wearing the monitor as directed. He developed a rash on his chest. It did not spread past. There were no new detergents, soaps, or other issues. The rash was itching and the lesions would become open. They would start to heal as he with stickers around, but it was uncomfortable for him. He has not had any presyncope or syncope. His palpitations have not changed. No chest pain. No new shortness of breath or dyspnea on exertion.   Past Medical History:  Diagnosis Date  . Adenomatous polyp of colon 03/17/15  . Anemia   . CMML (chronic myelomonocytic leukemia) ([place]) 06/24/2015  . Helicobacter pylori gastritis (chronic gastritis) 03/17/15  . Iron deficiency anemia 06/15/2015  . 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 MALT lymphoma ([place]) 06/15/2015  . Smoker unmotivated to quit 06/15/2015    Past Surgical History:  Procedure Laterality Date  . colonscopy    . ESOPHAGOGASTRODUODENOSCOPY ENDOSCOPY     prior- Dr. Ulyses Amor office  . EUS N/A 05/21/2015   Procedure: FULL UPPER ENDOSCOPIC ULTRASOUND (EUS) RADIAL;  Surgeon: Carol Ada, MD;  Location: [facility] ENDOSCOPY;  Service: Endoscopy;  Laterality: N/A;  . HYDROCELE EXCISION  09/22/2011   Procedure: HYDROCELECTOMY ADULT;  Surgeon: Claybon Jabs, MD;  Location: [facility];  Service: Urology;  Laterality: Left;    Current Outpatient Prescriptions  Medication Sig Dispense Refill  . acetaminophen (TYLENOL)  500 MG tablet Take 500 mg by mouth every 6 (six) hours as needed.     No current facility-administered medications for this visit.     Allergies:   Codeine    Social History:  The patient  reports that he has been smoking Cigars.  He has smoked for the past 50.00 years. He has never used smokeless tobacco. He reports that he does not drink alcohol or use drugs.   Family History:  The patient's family history includes Cancer in his father and mother.    ROS:  Please see the history of present illness. All other systems are reviewed and negative.    PHYSICAL EXAM: VS:  BP 132/87   Pulse (!) 56   SpO2 99%  , BMI There is no height or weight on file to calculate BMI. GEN: Well nourished, well developed, male in no acute distress  HEENT: normal for age  Neck: no JVD, no carotid bruit, no masses Cardiac: RRR; no murmur, no rubs, or gallops Respiratory:  clear to auscultation bilaterally, normal work of breathing GI: soft, nontender, nondistended, + BS MS: no deformity or atrophy; no edema; distal pulses are 2+ in all 4 extremities   Skin: warm and dry, pruritic rash with some open lesions noted on his chest and mid upper abdomen. No signs of infection Neuro:  Strength and sensation are intact Psych: euthymic mood, full affect   EKG:  EKG is not ordered today.  Recent Labs: 09/18/2016: ALT 8; BUN  19; Creat 1.19; Hemoglobin 9.2; Platelets 106; Potassium 4.3; Sodium 140; TSH 3.72    Lipid Panel    Component Value Date/Time   CHOL 105 09/18/2016 0934   TRIG 63 09/18/2016 0934   HDL 29 (L) 09/18/2016 0934   CHOLHDL 3.6 09/18/2016 0934   VLDL 13 09/18/2016 0934   LDLCALC 63 09/18/2016 0934     Wt Readings from Last 3 Encounters:  09/28/16 165 lb (74.8 kg)  09/26/16 165 lb (74.8 kg)  09/18/16 163 lb (73.9 kg)     Other studies Reviewed: Additional studies/ records that were reviewed today include: Office notes.  ASSESSMENT AND PLAN:  1.  Rash: He has a pruritic rash on  his chest. There are no signs of infection. He was given hypoallergenic stickers so that he can continue using the monitor. He was offered a prescription for triamcinolone cream, but his wife states that they already have that at home. She is to confirm the name and strength of the cream when she gets home to make sure it is the same. If it is he can apply to the rash. If not, they should call. Any side effects from the medication, they should contact us. If it does not get better, they should contact us.  2. Dizziness: He is to complete the monitor and then follow-up with Dr. Gwenlyn Found as scheduled. [place] a cell [place], and have trouble getting a cell signal inside their home, his wife is not sure how well the monitor will be transmitting.   Current medicines are reviewed at length with the patient today.  The patient does not have concerns regarding medicines.  The following changes have been made:  Triamcinolone cream 0.1%  Labs/ tests ordered today include:  No orders of the defined types were placed in this encounter.    Disposition:   FU with Dr. Gwenlyn Found  Signed, Rosaria Ferries, PA-C  10/09/2016 9:07 AM    [place] Phone: [phone]; Fax: [phone]  This note was written with the assistance of speech recognition software. Please excuse any transcriptional errors.

## 2016-10-16 ENCOUNTER — Telehealth: Payer: Self-pay | Admitting: Cardiovascular Disease

## 2016-10-16 DIAGNOSIS — C9310 Chronic myelomonocytic leukemia not having achieved remission: Secondary | ICD-10-CM | POA: Diagnosis not present

## 2016-10-16 NOTE — Telephone Encounter (Signed)
New message      Pt is wearing a monitor.  Patient received a call from the monitor company stating they received something abnormal.  Did they call us?  Should pt come to the office?

## 2016-10-16 NOTE — Telephone Encounter (Signed)
No notification from [employer] monitoring services has been received Reviewed monitor online report - no critical alerts  Patient's wife states [employer] calls them as they "got a signal that something was wrong" Patient's wife states there was no issue going on at the time stated - around 0930 today  Advised that it could be an issue w/signal - she states the monitor goes off all night long due to connectivity issues She states they do not get good service  Advised we would get notified via phone & fax of serious or critical monitor alerts.  No further action needed

## 2016-10-27 ENCOUNTER — Telehealth: Payer: Self-pay | Admitting: Cardiovascular Disease

## 2016-10-27 NOTE — Telephone Encounter (Signed)
New message  Pt voiced there was issue with the monitor due to pt flaring up. Pt took device off 2-3 sooner than. pts wife wanted [place] to be aware.  Please f/u pt if needed

## 2016-10-27 NOTE — Telephone Encounter (Signed)
Left msg to call.

## 2016-10-30 ENCOUNTER — Encounter: Payer: Self-pay | Admitting: Family Medicine

## 2016-10-31 ENCOUNTER — Ambulatory Visit: Payer: Medicare HMO | Admitting: Cardiovascular Disease

## 2016-10-31 ENCOUNTER — Ambulatory Visit (INDEPENDENT_AMBULATORY_CARE_PROVIDER_SITE_OTHER): Payer: Medicare HMO | Admitting: Cardiovascular Disease

## 2016-10-31 ENCOUNTER — Encounter: Payer: Self-pay | Admitting: Cardiovascular Disease

## 2016-10-31 DIAGNOSIS — R42 Dizziness and giddiness: Secondary | ICD-10-CM

## 2016-10-31 DIAGNOSIS — I208 Other forms of angina pectoris: Secondary | ICD-10-CM | POA: Diagnosis not present

## 2016-10-31 NOTE — Patient Instructions (Addendum)
Medication Instructions: Your physician recommends that you continue on your current medications as directed. Please refer to the Current Medication list given to you today.   Follow-Up: Your physician wants you to follow-up in: 6 months with Dr. Gwenlyn Found. You will receive a reminder letter in the mail two months in advance. If you don't receive a letter, please call our office to schedule the follow-up appointment.  If you need a refill on your cardiac medications before your next appointment, please call your pharmacy.   [employer] monitor brought back to office by pt today.

## 2016-10-31 NOTE — Assessment & Plan Note (Signed)
Jonathan Green returns today for follow-up of his 2-D echo and Myoview stress test of which were normal and performed any dilation of chest pain. He has had no recurrent symptoms.

## 2016-10-31 NOTE — Assessment & Plan Note (Signed)
Jonathan Green returns today for follow-up of his. The event monitor which has not yet been processed. Apparently had an allergic reaction to the electrodes and is questionable given his living situation whether there was any information was transmitted. Luckily, he has had no recurrent dizzy episodes. This was done because of PVCs and a "effective slow heart rate".

## 2016-10-31 NOTE — Progress Notes (Signed)
Jonathan Green returns today for follow-up of his noninvasive test. 2-D echo was normal as was a Myoview stress test. History did the monitor has yet to be process. He has had no recurrent symptoms. I will see him back in 6 months.

## 2016-11-01 ENCOUNTER — Telehealth: Payer: Self-pay | Admitting: Cardiovascular Disease

## 2016-11-01 NOTE — Telephone Encounter (Signed)
Returned call to wife (ok per DPR)-wanting to know if monitor has been mailed out (brought in yesterday at [place]).    Spoke to [employer] primary nurse-has monitor and will mail out today.  Advised wife of this.  Requesting we call when it has definitely been mailed out as she must call the company and let them know.

## 2016-11-01 NOTE — Telephone Encounter (Signed)
New Message   Per pt wife wanting to know if the monitor that was turned in yesterday shipped out yesterday or today. Requesting a call back

## 2016-11-02 NOTE — Telephone Encounter (Signed)
Spoke to pt wife--OK per DPR to let her know monitor was put in [employer] drop box last night and should be mailed out today.  Pt wife verbalized understanding and thanks.

## 2016-11-03 NOTE — Telephone Encounter (Signed)
Patient seen 10/31/16

## 2016-11-09 DIAGNOSIS — C9310 Chronic myelomonocytic leukemia not having achieved remission: Secondary | ICD-10-CM | POA: Diagnosis not present

## 2016-11-10 ENCOUNTER — Telehealth: Payer: Self-pay | Admitting: Cardiovascular Disease

## 2016-11-10 NOTE — Telephone Encounter (Signed)
Spoke with wife she states that pt sent monitor back and they received on 11-03-16 and states they told her that they have already faxed results [phone]

## 2016-11-10 NOTE — Telephone Encounter (Signed)
Fax received given to [facility] for [place] review

## 2016-11-10 NOTE — Telephone Encounter (Signed)
New Message   Pt calling for monitor results - wants results left on voicemail.

## 2016-11-10 NOTE — Telephone Encounter (Signed)
Spoke with Alinda Sierras ar [employer] she states that she will fax report again.

## 2016-11-14 ENCOUNTER — Telehealth: Payer: Self-pay | Admitting: Cardiovascular Disease

## 2016-11-14 NOTE — Telephone Encounter (Signed)
Received End of Service Report for pt monitor. Dr. Gwenlyn Found reviewed 11/10/16 and noted Sinus Rhythm w/ PVC's.

## 2016-11-14 NOTE — Telephone Encounter (Signed)
Cardiac event monitor  Order: [id number]  Status:  Final result Visible to patient:  No (Not Released) Dx:  Dizziness; PVC's (premature ventricul...  Notes Recorded by Therisa Doyne on 11/14/2016 at 12:13 PM EDT Results given to pt. Pt verbalized understanding.  ------  Notes Recorded by Lorretta Harp, MD on 11/12/2016 at 4:11 PM EDT 1. SR/ SB/ST with PVCs

## 2016-12-07 DIAGNOSIS — C9310 Chronic myelomonocytic leukemia not having achieved remission: Secondary | ICD-10-CM | POA: Diagnosis not present

## 2016-12-21 DIAGNOSIS — C9310 Chronic myelomonocytic leukemia not having achieved remission: Secondary | ICD-10-CM | POA: Diagnosis not present

## 2017-01-11 DIAGNOSIS — L989 Disorder of the skin and subcutaneous tissue, unspecified: Secondary | ICD-10-CM | POA: Diagnosis not present

## 2017-01-11 DIAGNOSIS — C884 Extranodal marginal zone B-cell lymphoma of mucosa-associated lymphoid tissue [MALT-lymphoma]: Secondary | ICD-10-CM | POA: Diagnosis not present

## 2017-01-11 DIAGNOSIS — C9310 Chronic myelomonocytic leukemia not having achieved remission: Secondary | ICD-10-CM | POA: Diagnosis not present

## 2017-01-11 DIAGNOSIS — N289 Disorder of kidney and ureter, unspecified: Secondary | ICD-10-CM | POA: Diagnosis not present

## 2017-02-22 DIAGNOSIS — C9310 Chronic myelomonocytic leukemia not having achieved remission: Secondary | ICD-10-CM | POA: Diagnosis not present

## 2017-04-05 DIAGNOSIS — C9310 Chronic myelomonocytic leukemia not having achieved remission: Secondary | ICD-10-CM | POA: Diagnosis not present

## 2017-05-07 IMAGING — CT NM PET TUM IMG INITIAL (PI) SKULL BASE T - THIGH
1 of 7 series · 1 of 25 positions shown · non-contrast
Comparison: None.

CLINICAL DATA: Initial treatment strategy for gastric MALT
lymphoma.

EXAM:
NUCLEAR MEDICINE PET SKULL BASE TO THIGH
TECHNIQUE: 7.86 mCi F-18 FDG was injected intravenously. Full-ring PET imaging
was performed from the skull base to thigh after the radiotracer. CT
data was obtained and used for attenuation correction and anatomic
localization.
FASTING BLOOD GLUCOSE:  Value: 98 mg/dl

[Series 4: ct sk_thigh 5.0 b31f · axial · 5.0mm · 0.86mm/px · 1 of 213 slices shown]
[im 213/213  brain]
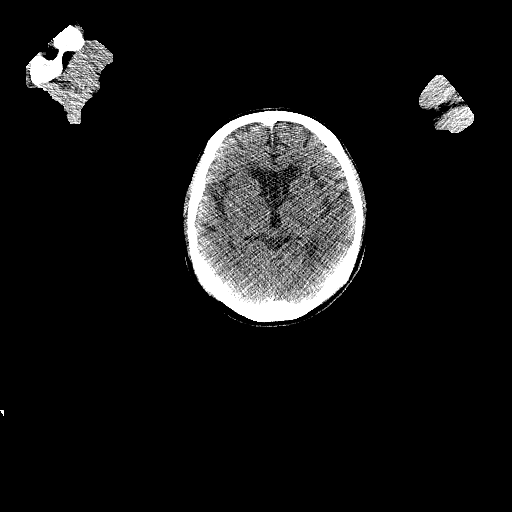

[1 of 25 positions shown; findings below may reference images not displayed]

FINDINGS: NECK

No hypermetabolic cervical lymph nodes are identified.There is
mildly prominent but symmetric hypermetabolic activity within the
palatine tonsils (SUV max 6.9). Mildly prominent activity within the
submandibular glands also appears symmetric and is likely
physiologic. The parotid activity appears unremarkable. No focal
lesions of the pharyngeal mucosal space identified.

CHEST

There are no hypermetabolic mediastinal, hilar or axillary lymph
nodes. There is no abnormal pulmonary metabolic activity. 4 mm
nodule in the superior segment of the right lower lobe is noted on
image 22. No other pulmonary nodules identified. There is mild
atherosclerosis of the aorta, great vessels and coronary arteries.

ABDOMEN/PELVIS

There is no hypermetabolic activity within the liver, adrenal
glands, spleen or pancreas. There is no hypermetabolic nodal
activity. No abnormal activity is seen within the gastrointestinal
tract. Nonobstructing left renal calculi and mild aortoiliac
atherosclerosis noted.

SKELETON

There is no hypermetabolic activity to suggest osseous metastatic
disease. There are degenerative changes within the lower lumbar
spine.
IMPRESSION: 1. No definite abnormal metabolic activity to suggest active
lymphoma. Specifically, no focal abnormal activity seen within the
stomach.
2. Nonspecific symmetric activity within the palatine tonsils and
submandibular glands, probably physiologic.
3. Nonspecific 4 mm nodule in the superior segment of the right
lower lobe.
4. Atherosclerosis and left renal calculi noted.

## 2017-05-17 DIAGNOSIS — L988 Other specified disorders of the skin and subcutaneous tissue: Secondary | ICD-10-CM | POA: Diagnosis not present

## 2017-05-17 DIAGNOSIS — N289 Disorder of kidney and ureter, unspecified: Secondary | ICD-10-CM | POA: Diagnosis not present

## 2017-05-17 DIAGNOSIS — D649 Anemia, unspecified: Secondary | ICD-10-CM | POA: Diagnosis not present

## 2017-05-17 DIAGNOSIS — R0789 Other chest pain: Secondary | ICD-10-CM | POA: Diagnosis not present

## 2017-05-17 DIAGNOSIS — Z8572 Personal history of non-Hodgkin lymphomas: Secondary | ICD-10-CM | POA: Diagnosis not present

## 2017-05-17 DIAGNOSIS — C9310 Chronic myelomonocytic leukemia not having achieved remission: Secondary | ICD-10-CM | POA: Diagnosis not present

## 2017-05-17 DIAGNOSIS — Z7982 Long term (current) use of aspirin: Secondary | ICD-10-CM | POA: Diagnosis not present

## 2017-06-14 DIAGNOSIS — C9310 Chronic myelomonocytic leukemia not having achieved remission: Secondary | ICD-10-CM | POA: Diagnosis not present

## 2017-07-12 DIAGNOSIS — C9310 Chronic myelomonocytic leukemia not having achieved remission: Secondary | ICD-10-CM | POA: Diagnosis not present

## 2017-08-30 DIAGNOSIS — C884 Extranodal marginal zone B-cell lymphoma of mucosa-associated lymphoid tissue [MALT-lymphoma]: Secondary | ICD-10-CM | POA: Diagnosis not present

## 2017-08-30 DIAGNOSIS — Z23 Encounter for immunization: Secondary | ICD-10-CM | POA: Diagnosis not present

## 2017-08-30 DIAGNOSIS — C9310 Chronic myelomonocytic leukemia not having achieved remission: Secondary | ICD-10-CM | POA: Diagnosis not present

## 2017-08-30 DIAGNOSIS — R079 Chest pain, unspecified: Secondary | ICD-10-CM | POA: Diagnosis not present

## 2017-08-30 DIAGNOSIS — M549 Dorsalgia, unspecified: Secondary | ICD-10-CM | POA: Diagnosis not present

## 2017-08-30 DIAGNOSIS — M542 Cervicalgia: Secondary | ICD-10-CM | POA: Diagnosis not present

## 2017-08-30 DIAGNOSIS — L989 Disorder of the skin and subcutaneous tissue, unspecified: Secondary | ICD-10-CM | POA: Diagnosis not present

## 2017-08-30 DIAGNOSIS — N289 Disorder of kidney and ureter, unspecified: Secondary | ICD-10-CM | POA: Diagnosis not present

## 2017-10-04 DIAGNOSIS — C9310 Chronic myelomonocytic leukemia not having achieved remission: Secondary | ICD-10-CM | POA: Diagnosis not present

## 2017-10-19 ENCOUNTER — Ambulatory Visit (INDEPENDENT_AMBULATORY_CARE_PROVIDER_SITE_OTHER): Payer: Medicare HMO | Admitting: Family Medicine

## 2017-10-19 ENCOUNTER — Encounter: Payer: Self-pay | Admitting: Family Medicine

## 2017-10-19 VITALS — BP 130/80 | HR 62 | Temp 98.0°F | Resp 16 | Wt 162.8 lb

## 2017-10-19 DIAGNOSIS — C9310 Chronic myelomonocytic leukemia not having achieved remission: Secondary | ICD-10-CM | POA: Diagnosis not present

## 2017-10-19 DIAGNOSIS — J209 Acute bronchitis, unspecified: Secondary | ICD-10-CM

## 2017-10-19 MED ORDER — AZITHROMYCIN 500 MG PO TABS: 500.0000 mg | ORAL_TABLET | Freq: Every day | ORAL | 0 refills | Status: DC

## 2017-10-19 NOTE — Patient Instructions (Signed)
Call in a week and let me know how he is doing

## 2017-10-19 NOTE — Progress Notes (Signed)
   Subjective:    Patient ID: Jonathan Green, male    DOB: 12/13/1940, 77 y.o.   MRN: [id number]  HPI He complains of a 4-week history of difficulty with cough, nasal congestion, rhinorrhea, PND but no fever, chills, sore throat, earache or shortness of breath.  He is followed in [place] for his underlying leukemia.  He does complain of intermittent back pain over the entire back but states that it only lasts roughly 10 minutes.   Review of Systems     Objective:   Physical Exam Alert and in no distress. Tympanic membranes and canals are normal. Pharyngeal area is normal. Neck is supple without adenopathy or thyromegaly. Cardiac exam shows a regular sinus rhythm without murmurs or gallops. Lungs are clear to auscultation.        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  I will treat him with azithromycin and recommend that he call me back in 7-10 days if still having symptoms.  Did not feel the need to get an x-ray. Explained that the pain last such a short period of time that it would be difficult to treat with any medication.  He and his sister seem to understand this.

## 2017-10-25 DIAGNOSIS — C9310 Chronic myelomonocytic leukemia not having achieved remission: Secondary | ICD-10-CM | POA: Diagnosis not present

## 2017-10-31 ENCOUNTER — Telehealth: Payer: Self-pay | Admitting: Family Medicine

## 2017-10-31 NOTE — Telephone Encounter (Signed)
Pt wife called and wanted to let you know how he was doing, states that he is still coughing just as bad as he was when he was in the office, states he is OTC dayquil during the day, and at night he was taking he was taking the night time nyquil but states it seems like that was making him cough more, he finished the antibiotic. She is wanting  To know what he needs to do now if he needs another antibiotic in, pt can be reached at [phone] pt uses  [place], [place] she states he does not want to come back in

## 2017-11-01 MED ORDER — AMOXICILLIN-POT CLAVULANATE 875-125 MG PO TABS: 1.0000 | ORAL_TABLET | Freq: Two times a day (BID) | ORAL | 0 refills | Status: DC

## 2017-11-01 NOTE — Telephone Encounter (Signed)
Let them know that I called in a different antibiotic

## 2017-11-01 NOTE — Telephone Encounter (Signed)
Done. Thanks [facility]

## 2017-11-06 ENCOUNTER — Ambulatory Visit (INDEPENDENT_AMBULATORY_CARE_PROVIDER_SITE_OTHER): Payer: Medicare HMO | Admitting: Family Medicine

## 2017-11-06 ENCOUNTER — Encounter: Payer: Self-pay | Admitting: Family Medicine

## 2017-11-06 VITALS — BP 140/70 | HR 80 | Temp 98.5°F | Ht 65.0 in | Wt 165.4 lb

## 2017-11-06 DIAGNOSIS — F959 Tic disorder, unspecified: Secondary | ICD-10-CM

## 2017-11-06 DIAGNOSIS — J40 Bronchitis, not specified as acute or chronic: Secondary | ICD-10-CM | POA: Diagnosis not present

## 2017-11-06 DIAGNOSIS — C9310 Chronic myelomonocytic leukemia not having achieved remission: Secondary | ICD-10-CM | POA: Diagnosis not present

## 2017-11-06 DIAGNOSIS — C884 Extranodal marginal zone B-cell lymphoma of mucosa-associated lymphoid tissue [MALT-lymphoma]: Secondary | ICD-10-CM | POA: Diagnosis not present

## 2017-11-06 DIAGNOSIS — R1011 Right upper quadrant pain: Secondary | ICD-10-CM | POA: Diagnosis not present

## 2017-11-06 LAB — POCT URINALYSIS DIP (PROADVANTAGE DEVICE)
BILIRUBIN UA: NEGATIVE mg/dL
Bilirubin, UA: NEGATIVE
GLUCOSE UA: NEGATIVE mg/dL
Leukocytes, UA: NEGATIVE
Nitrite, UA: NEGATIVE
Protein Ur, POC: 30 mg/dL — AB
SPECIFIC GRAVITY, URINE: 1.025
Urobilinogen, Ur: NEGATIVE
pH, UA: 6 (ref 5.0–8.0)

## 2017-11-06 NOTE — Patient Instructions (Signed)
We are arranging for an ultrasound to look at your liver and gall bladder, to see why you are having those painful episodes. In the meantime, avoid greasy, fried, spicy foods--stick with bland foods.

## 2017-11-06 NOTE — Progress Notes (Signed)
Chief Complaint  Patient presents with  . Abdominal Pain    right sided abdominal pain x 1 month but getting increasingly more frequent and worse. Could not give UA.    Pain starts under his ribs on the right side, and radiates around to the back, and sometimes radiates into the right chest.  Once it started in the center, and radiated around the right side.  He has had 4 episodes since yesterday. Similar episodes in the past, in the last 2 months, but these have been very close together, and more severe.  Pain lasts from 5-10 minutes, up to 20 minutes one time. Resolves on its own. He did have some bilious vomiting with one of these episodes (not the last one).  Currently denies any pain. Gives a distant h/o kidney stone. Denies any urinary complaints, flank pain, hematuria.  Seen 2/15, diagnosed with bronchitis, treated with Z-pak (500mg  x 3 days). Didn't get better, so antibiotic was changed to augmentin on 2/27.  Denies diarrhea or other side effects. Still has a barky cough, but is getting much drier and less often; improving on the new antibiotic. Head congestion resolved.  PMH, PSH, SH reviewed  Pt w/CML and MALT lymphoma. Followed at [place] labs 10/25/17 at [facility]. Hg 9.1 (s/p blood transfusion, 2U, Hg 7.7 on 1/31). Normal WBC, plt 66 LFTs last checked 08/30/17, normal  Smokes cigars  Outpatient Encounter Medications as of 11/06/2017  Medication Sig  . amoxicillin-clavulanate (AUGMENTIN) 875-125 MG tablet Take 1 tablet by mouth 2 (two) times daily.  . Multiple Vitamins-Minerals (EMERGEN-C VITAMIN C PO) Take by mouth.  . Naproxen Sodium (ALEVE PO) Take by mouth.  . [DISCONTINUED] azithromycin (ZITHROMAX) 500 MG tablet Take 1 tablet (500 mg total) by mouth daily.   No facility-administered encounter medications on file as of 11/06/2017.     ROS: No fever, chills, cough is significantly better, URI symptoms resolved.  +nausea with one episode of pain.  No other nausea,  vomiting (just once, with pain). No diarrhea. No bleeding. No urinary complaints. No rashes. twiching on face, no change. No other neuro complaints  PHYSICAL EXAM:  BP 140/70   Pulse 80   Temp 98.5 F (36.9 C) (Tympanic)   Ht 5\' 5"  (1.651 m)   Wt 165 lb 6.4 oz (75 kg)   BMI 27.52 kg/m   Well appearing, pleasant, elderly male in good spirits, accompanied by his wife; making jokes throughout the visit HEENT: PERRL, EOMI, conjunctiva and sclera are clear, anicteric. Twitching on right face. OP is clear Neck: no lymphadenopathy, thyromegaly or mass Heart: regular rate and rhythm, trace murmur Lungs: clear bilaterally Abdomen: normal bowel sounds, soft. Tender at RUQ. Negative Murphy sign. No rebound, guarding.  Back: no CVA tenderness Extremities: no edema Skin: normal turgor, no rash, no jaundice Neuro: alert and oriented, normal gait. Twitching R face, intermittent, o/w cranial nerves all normal Psych: normal mood, affect, hygiene and grooming   Slight blood noted on urine dip  ASSESSMENT/PLAN:  RUQ pain - history suggestive of cholelithiasis. check [place].  no evidence of infection. Consider CT if normal, to eval for kidney stones, given microscopic hematuria - Plan: US Abdomen Limited RUQ, POCT Urinalysis DIP (Proadvantage Device), CANCELED: US Abdomen Limited RUQ  Chronic myelomonocytic leukemia not having achieved remission ([place]) - followed at [facility]; recent transfusion for anemia  MALT lymphoma ([place])  Facial tic  Bronchitis - improving; complete course of Augmentin  [place] scheduled for tomorrow morning, to contact my cell phone  with results (out of office)

## 2017-11-07 ENCOUNTER — Ambulatory Visit (HOSPITAL_COMMUNITY)
Admission: RE | Admit: 2017-11-07 | Discharge: 2017-11-07 | Disposition: A | Discharge status: Home / Self Care | Payer: Medicare HMO | Source: Ambulatory Visit | Attending: Family Medicine | Admitting: Family Medicine

## 2017-11-07 ENCOUNTER — Other Ambulatory Visit: Payer: Self-pay | Admitting: *Deleted

## 2017-11-07 ENCOUNTER — Telehealth: Payer: Self-pay | Admitting: *Deleted

## 2017-11-07 DIAGNOSIS — R319 Hematuria, unspecified: Secondary | ICD-10-CM

## 2017-11-07 DIAGNOSIS — R1011 Right upper quadrant pain: Secondary | ICD-10-CM

## 2017-11-07 NOTE — Telephone Encounter (Signed)
Called patient to give ultrasound results. Asked him to keep track of when pain happens. States that it just happens whenever-no specific time. Recommended that he try Gas-X when pain occurs. Patient said that he has history of kidney stones and is asking to have CT ordered to rule this out.

## 2017-11-07 NOTE — Telephone Encounter (Signed)
His urine only showed trace blood.  Many stones will pass on their own--it is important that he push fluids to stay very well hydrated.  I don't want to do any CT that has dye, due to his Cr 1.34 in December 2018 (done at [facility]); I believe they do renal stone protocol without dye, that would be fine, but I don't think is urgent.  Okay to get him set up for this. In the interim, to push fluids, use tylenol prn pain.  Try simethicone (Gas-X) when it happens, and keep a log of when it happens, what helps, what doesn't.  Follow up with [facility] next week if not better.

## 2017-11-07 NOTE — Telephone Encounter (Signed)
Scheduled at [facility] for this Fri 11/09/17 @ 4:00pm. NPO 30 minutes prior-left message for patient.

## 2017-11-09 ENCOUNTER — Ambulatory Visit (HOSPITAL_COMMUNITY)
Admission: RE | Admit: 2017-11-09 | Discharge: 2017-11-09 | Disposition: A | Discharge status: Home / Self Care | Payer: Medicare HMO | Source: Ambulatory Visit | Attending: Family Medicine | Admitting: Family Medicine

## 2017-11-09 DIAGNOSIS — R1011 Right upper quadrant pain: Secondary | ICD-10-CM

## 2017-11-09 DIAGNOSIS — R195 Other fecal abnormalities: Secondary | ICD-10-CM | POA: Insufficient documentation

## 2017-11-09 DIAGNOSIS — K5730 Diverticulosis of large intestine without perforation or abscess without bleeding: Secondary | ICD-10-CM | POA: Insufficient documentation

## 2017-11-09 DIAGNOSIS — R319 Hematuria, unspecified: Secondary | ICD-10-CM | POA: Diagnosis present

## 2017-11-09 DIAGNOSIS — N200 Calculus of kidney: Secondary | ICD-10-CM | POA: Insufficient documentation

## 2017-11-09 DIAGNOSIS — R935 Abnormal findings on diagnostic imaging of other abdominal regions, including retroperitoneum: Secondary | ICD-10-CM | POA: Insufficient documentation

## 2017-11-14 DIAGNOSIS — R1011 Right upper quadrant pain: Secondary | ICD-10-CM | POA: Diagnosis not present

## 2017-11-14 DIAGNOSIS — M549 Dorsalgia, unspecified: Secondary | ICD-10-CM | POA: Diagnosis not present

## 2017-11-14 DIAGNOSIS — R112 Nausea with vomiting, unspecified: Secondary | ICD-10-CM | POA: Diagnosis not present

## 2017-12-03 DIAGNOSIS — N19 Unspecified kidney failure: Secondary | ICD-10-CM | POA: Diagnosis not present

## 2017-12-03 DIAGNOSIS — C884 Extranodal marginal zone B-cell lymphoma of mucosa-associated lymphoid tissue [MALT-lymphoma]: Secondary | ICD-10-CM | POA: Diagnosis not present

## 2017-12-03 DIAGNOSIS — N289 Disorder of kidney and ureter, unspecified: Secondary | ICD-10-CM | POA: Diagnosis not present

## 2017-12-03 DIAGNOSIS — R0789 Other chest pain: Secondary | ICD-10-CM | POA: Diagnosis not present

## 2017-12-03 DIAGNOSIS — C9310 Chronic myelomonocytic leukemia not having achieved remission: Secondary | ICD-10-CM | POA: Diagnosis not present

## 2018-01-02 DIAGNOSIS — C9310 Chronic myelomonocytic leukemia not having achieved remission: Secondary | ICD-10-CM | POA: Diagnosis not present

## 2018-02-04 DIAGNOSIS — C9310 Chronic myelomonocytic leukemia not having achieved remission: Secondary | ICD-10-CM | POA: Diagnosis not present

## 2018-03-04 DIAGNOSIS — R079 Chest pain, unspecified: Secondary | ICD-10-CM | POA: Diagnosis not present

## 2018-03-04 DIAGNOSIS — R5383 Other fatigue: Secondary | ICD-10-CM | POA: Diagnosis not present

## 2018-03-04 DIAGNOSIS — N19 Unspecified kidney failure: Secondary | ICD-10-CM | POA: Diagnosis not present

## 2018-03-04 DIAGNOSIS — C9310 Chronic myelomonocytic leukemia not having achieved remission: Secondary | ICD-10-CM | POA: Diagnosis not present

## 2018-03-04 DIAGNOSIS — Z8572 Personal history of non-Hodgkin lymphomas: Secondary | ICD-10-CM | POA: Diagnosis not present

## 2018-03-04 DIAGNOSIS — C884 Extranodal marginal zone B-cell lymphoma of mucosa-associated lymphoid tissue [MALT-lymphoma]: Secondary | ICD-10-CM | POA: Diagnosis not present

## 2018-03-04 DIAGNOSIS — D696 Thrombocytopenia, unspecified: Secondary | ICD-10-CM | POA: Diagnosis not present

## 2018-03-04 DIAGNOSIS — D649 Anemia, unspecified: Secondary | ICD-10-CM | POA: Diagnosis not present

## 2018-03-04 DIAGNOSIS — N189 Chronic kidney disease, unspecified: Secondary | ICD-10-CM | POA: Diagnosis not present

## 2018-03-13 ENCOUNTER — Telehealth: Payer: Self-pay | Admitting: Family Medicine

## 2018-03-13 NOTE — Telephone Encounter (Signed)
Called and left message that pt needs wellness visit, and needs ([facility] in care in colorectal cancer)

## 2018-03-18 DIAGNOSIS — C9310 Chronic myelomonocytic leukemia not having achieved remission: Secondary | ICD-10-CM | POA: Diagnosis not present

## 2018-04-01 DIAGNOSIS — C9310 Chronic myelomonocytic leukemia not having achieved remission: Secondary | ICD-10-CM | POA: Diagnosis not present

## 2018-04-11 DIAGNOSIS — N289 Disorder of kidney and ureter, unspecified: Secondary | ICD-10-CM | POA: Diagnosis not present

## 2018-04-11 DIAGNOSIS — C884 Extranodal marginal zone B-cell lymphoma of mucosa-associated lymphoid tissue [MALT-lymphoma]: Secondary | ICD-10-CM | POA: Diagnosis not present

## 2018-04-11 DIAGNOSIS — C9310 Chronic myelomonocytic leukemia not having achieved remission: Secondary | ICD-10-CM | POA: Diagnosis not present

## 2018-04-18 DIAGNOSIS — C9310 Chronic myelomonocytic leukemia not having achieved remission: Secondary | ICD-10-CM | POA: Diagnosis not present

## 2018-04-24 DIAGNOSIS — C9310 Chronic myelomonocytic leukemia not having achieved remission: Secondary | ICD-10-CM | POA: Diagnosis not present

## 2018-05-02 DIAGNOSIS — C884 Extranodal marginal zone B-cell lymphoma of mucosa-associated lymphoid tissue [MALT-lymphoma]: Secondary | ICD-10-CM | POA: Diagnosis not present

## 2018-05-02 DIAGNOSIS — C9310 Chronic myelomonocytic leukemia not having achieved remission: Secondary | ICD-10-CM | POA: Diagnosis not present

## 2018-05-09 DIAGNOSIS — C9310 Chronic myelomonocytic leukemia not having achieved remission: Secondary | ICD-10-CM | POA: Diagnosis not present

## 2018-05-09 DIAGNOSIS — Z79899 Other long term (current) drug therapy: Secondary | ICD-10-CM | POA: Diagnosis not present

## 2018-05-16 DIAGNOSIS — R05 Cough: Secondary | ICD-10-CM | POA: Diagnosis not present

## 2018-05-16 DIAGNOSIS — C9310 Chronic myelomonocytic leukemia not having achieved remission: Secondary | ICD-10-CM | POA: Diagnosis not present

## 2018-05-23 DIAGNOSIS — C9310 Chronic myelomonocytic leukemia not having achieved remission: Secondary | ICD-10-CM | POA: Diagnosis not present

## 2018-05-23 DIAGNOSIS — Z79899 Other long term (current) drug therapy: Secondary | ICD-10-CM | POA: Diagnosis not present

## 2018-06-06 DIAGNOSIS — C9310 Chronic myelomonocytic leukemia not having achieved remission: Secondary | ICD-10-CM | POA: Diagnosis not present

## 2018-06-06 DIAGNOSIS — Z23 Encounter for immunization: Secondary | ICD-10-CM | POA: Diagnosis not present

## 2018-06-13 DIAGNOSIS — C9310 Chronic myelomonocytic leukemia not having achieved remission: Secondary | ICD-10-CM | POA: Diagnosis not present

## 2018-06-20 DIAGNOSIS — C9310 Chronic myelomonocytic leukemia not having achieved remission: Secondary | ICD-10-CM | POA: Diagnosis not present

## 2018-06-20 DIAGNOSIS — Z79899 Other long term (current) drug therapy: Secondary | ICD-10-CM | POA: Diagnosis not present

## 2018-06-27 DIAGNOSIS — R5383 Other fatigue: Secondary | ICD-10-CM | POA: Diagnosis not present

## 2018-06-27 DIAGNOSIS — Z8572 Personal history of non-Hodgkin lymphomas: Secondary | ICD-10-CM | POA: Diagnosis not present

## 2018-06-27 DIAGNOSIS — C884 Extranodal marginal zone B-cell lymphoma of mucosa-associated lymphoid tissue [MALT-lymphoma]: Secondary | ICD-10-CM | POA: Diagnosis not present

## 2018-06-27 DIAGNOSIS — Z79899 Other long term (current) drug therapy: Secondary | ICD-10-CM | POA: Diagnosis not present

## 2018-06-27 DIAGNOSIS — C9310 Chronic myelomonocytic leukemia not having achieved remission: Secondary | ICD-10-CM | POA: Diagnosis not present

## 2018-06-27 DIAGNOSIS — M6281 Muscle weakness (generalized): Secondary | ICD-10-CM | POA: Diagnosis not present

## 2018-07-04 DIAGNOSIS — C9310 Chronic myelomonocytic leukemia not having achieved remission: Secondary | ICD-10-CM | POA: Diagnosis not present

## 2018-07-08 DIAGNOSIS — C9310 Chronic myelomonocytic leukemia not having achieved remission: Secondary | ICD-10-CM | POA: Diagnosis not present

## 2018-07-15 DIAGNOSIS — C9310 Chronic myelomonocytic leukemia not having achieved remission: Secondary | ICD-10-CM | POA: Diagnosis not present

## 2018-07-22 DIAGNOSIS — C9310 Chronic myelomonocytic leukemia not having achieved remission: Secondary | ICD-10-CM | POA: Diagnosis not present

## 2018-07-29 DIAGNOSIS — C9310 Chronic myelomonocytic leukemia not having achieved remission: Secondary | ICD-10-CM | POA: Diagnosis not present

## 2018-08-05 DIAGNOSIS — D649 Anemia, unspecified: Secondary | ICD-10-CM | POA: Diagnosis not present

## 2018-08-05 DIAGNOSIS — C9310 Chronic myelomonocytic leukemia not having achieved remission: Secondary | ICD-10-CM | POA: Diagnosis not present

## 2018-08-05 DIAGNOSIS — R5383 Other fatigue: Secondary | ICD-10-CM | POA: Diagnosis not present

## 2018-08-05 DIAGNOSIS — R531 Weakness: Secondary | ICD-10-CM | POA: Diagnosis not present

## 2018-08-05 DIAGNOSIS — Z8572 Personal history of non-Hodgkin lymphomas: Secondary | ICD-10-CM | POA: Diagnosis not present

## 2018-08-12 DIAGNOSIS — C9310 Chronic myelomonocytic leukemia not having achieved remission: Secondary | ICD-10-CM | POA: Diagnosis not present

## 2018-08-19 DIAGNOSIS — D649 Anemia, unspecified: Secondary | ICD-10-CM | POA: Diagnosis not present

## 2018-08-19 DIAGNOSIS — C9310 Chronic myelomonocytic leukemia not having achieved remission: Secondary | ICD-10-CM | POA: Diagnosis not present

## 2018-08-26 DIAGNOSIS — C9310 Chronic myelomonocytic leukemia not having achieved remission: Secondary | ICD-10-CM | POA: Diagnosis not present

## 2018-09-02 DIAGNOSIS — C9310 Chronic myelomonocytic leukemia not having achieved remission: Secondary | ICD-10-CM | POA: Diagnosis not present

## 2018-09-09 DIAGNOSIS — D709 Neutropenia, unspecified: Secondary | ICD-10-CM | POA: Diagnosis not present

## 2018-09-09 DIAGNOSIS — C9310 Chronic myelomonocytic leukemia not having achieved remission: Secondary | ICD-10-CM | POA: Diagnosis not present

## 2018-09-09 DIAGNOSIS — Z8572 Personal history of non-Hodgkin lymphomas: Secondary | ICD-10-CM | POA: Diagnosis not present

## 2018-09-16 DIAGNOSIS — C9310 Chronic myelomonocytic leukemia not having achieved remission: Secondary | ICD-10-CM | POA: Diagnosis not present

## 2018-09-24 DIAGNOSIS — C9310 Chronic myelomonocytic leukemia not having achieved remission: Secondary | ICD-10-CM | POA: Diagnosis not present

## 2018-09-30 DIAGNOSIS — C9310 Chronic myelomonocytic leukemia not having achieved remission: Secondary | ICD-10-CM | POA: Diagnosis not present

## 2018-10-07 DIAGNOSIS — D649 Anemia, unspecified: Secondary | ICD-10-CM | POA: Diagnosis not present

## 2018-10-07 DIAGNOSIS — C9310 Chronic myelomonocytic leukemia not having achieved remission: Secondary | ICD-10-CM | POA: Diagnosis not present

## 2018-10-14 DIAGNOSIS — C9310 Chronic myelomonocytic leukemia not having achieved remission: Secondary | ICD-10-CM | POA: Diagnosis not present

## 2018-10-14 DIAGNOSIS — D649 Anemia, unspecified: Secondary | ICD-10-CM | POA: Diagnosis not present

## 2018-10-21 DIAGNOSIS — D649 Anemia, unspecified: Secondary | ICD-10-CM | POA: Diagnosis not present

## 2018-10-21 DIAGNOSIS — C9310 Chronic myelomonocytic leukemia not having achieved remission: Secondary | ICD-10-CM | POA: Diagnosis not present

## 2018-10-28 DIAGNOSIS — C9310 Chronic myelomonocytic leukemia not having achieved remission: Secondary | ICD-10-CM | POA: Diagnosis not present

## 2018-11-04 DIAGNOSIS — C9310 Chronic myelomonocytic leukemia not having achieved remission: Secondary | ICD-10-CM | POA: Diagnosis not present

## 2018-11-11 DIAGNOSIS — Z79899 Other long term (current) drug therapy: Secondary | ICD-10-CM | POA: Diagnosis not present

## 2018-11-11 DIAGNOSIS — M898X6 Other specified disorders of bone, lower leg: Secondary | ICD-10-CM | POA: Diagnosis not present

## 2018-11-11 DIAGNOSIS — R5383 Other fatigue: Secondary | ICD-10-CM | POA: Diagnosis not present

## 2018-11-11 DIAGNOSIS — D696 Thrombocytopenia, unspecified: Secondary | ICD-10-CM | POA: Diagnosis not present

## 2018-11-11 DIAGNOSIS — C9310 Chronic myelomonocytic leukemia not having achieved remission: Secondary | ICD-10-CM | POA: Diagnosis not present

## 2018-11-19 DIAGNOSIS — C9310 Chronic myelomonocytic leukemia not having achieved remission: Secondary | ICD-10-CM | POA: Diagnosis not present

## 2018-11-19 DIAGNOSIS — Z79899 Other long term (current) drug therapy: Secondary | ICD-10-CM | POA: Diagnosis not present

## 2018-11-25 DIAGNOSIS — C9310 Chronic myelomonocytic leukemia not having achieved remission: Secondary | ICD-10-CM | POA: Diagnosis not present

## 2018-12-02 DIAGNOSIS — C9310 Chronic myelomonocytic leukemia not having achieved remission: Secondary | ICD-10-CM | POA: Diagnosis not present

## 2018-12-09 DIAGNOSIS — Z79891 Long term (current) use of opiate analgesic: Secondary | ICD-10-CM | POA: Diagnosis not present

## 2018-12-09 DIAGNOSIS — M898X9 Other specified disorders of bone, unspecified site: Secondary | ICD-10-CM | POA: Diagnosis not present

## 2018-12-09 DIAGNOSIS — C9310 Chronic myelomonocytic leukemia not having achieved remission: Secondary | ICD-10-CM | POA: Diagnosis not present

## 2018-12-09 DIAGNOSIS — R5383 Other fatigue: Secondary | ICD-10-CM | POA: Diagnosis not present

## 2018-12-09 DIAGNOSIS — Z79899 Other long term (current) drug therapy: Secondary | ICD-10-CM | POA: Diagnosis not present

## 2018-12-16 DIAGNOSIS — C9310 Chronic myelomonocytic leukemia not having achieved remission: Secondary | ICD-10-CM | POA: Diagnosis not present

## 2018-12-23 DIAGNOSIS — C9310 Chronic myelomonocytic leukemia not having achieved remission: Secondary | ICD-10-CM | POA: Diagnosis not present

## 2018-12-30 DIAGNOSIS — C9310 Chronic myelomonocytic leukemia not having achieved remission: Secondary | ICD-10-CM | POA: Diagnosis not present

## 2019-01-06 DIAGNOSIS — C9310 Chronic myelomonocytic leukemia not having achieved remission: Secondary | ICD-10-CM | POA: Diagnosis not present

## 2019-01-06 DIAGNOSIS — C9200 Acute myeloblastic leukemia, not having achieved remission: Secondary | ICD-10-CM | POA: Diagnosis not present

## 2019-01-06 DIAGNOSIS — Z79891 Long term (current) use of opiate analgesic: Secondary | ICD-10-CM | POA: Diagnosis not present

## 2019-01-13 DIAGNOSIS — C9310 Chronic myelomonocytic leukemia not having achieved remission: Secondary | ICD-10-CM | POA: Diagnosis not present

## 2019-01-20 DIAGNOSIS — C9310 Chronic myelomonocytic leukemia not having achieved remission: Secondary | ICD-10-CM | POA: Diagnosis not present

## 2019-01-28 DIAGNOSIS — C9310 Chronic myelomonocytic leukemia not having achieved remission: Secondary | ICD-10-CM | POA: Diagnosis not present

## 2019-02-03 DIAGNOSIS — C9250 Acute myelomonocytic leukemia, not having achieved remission: Secondary | ICD-10-CM | POA: Diagnosis not present

## 2019-02-03 DIAGNOSIS — C9200 Acute myeloblastic leukemia, not having achieved remission: Secondary | ICD-10-CM | POA: Diagnosis not present

## 2019-02-03 DIAGNOSIS — Z79899 Other long term (current) drug therapy: Secondary | ICD-10-CM | POA: Diagnosis not present

## 2019-02-03 DIAGNOSIS — Z79891 Long term (current) use of opiate analgesic: Secondary | ICD-10-CM | POA: Diagnosis not present

## 2019-02-10 DIAGNOSIS — C9310 Chronic myelomonocytic leukemia not having achieved remission: Secondary | ICD-10-CM | POA: Diagnosis not present

## 2019-02-17 DIAGNOSIS — C9310 Chronic myelomonocytic leukemia not having achieved remission: Secondary | ICD-10-CM | POA: Diagnosis not present

## 2019-02-24 DIAGNOSIS — C9310 Chronic myelomonocytic leukemia not having achieved remission: Secondary | ICD-10-CM | POA: Diagnosis not present

## 2019-03-03 DIAGNOSIS — Z79899 Other long term (current) drug therapy: Secondary | ICD-10-CM | POA: Diagnosis not present

## 2019-03-03 DIAGNOSIS — Z79891 Long term (current) use of opiate analgesic: Secondary | ICD-10-CM | POA: Diagnosis not present

## 2019-03-03 DIAGNOSIS — C9310 Chronic myelomonocytic leukemia not having achieved remission: Secondary | ICD-10-CM | POA: Diagnosis not present

## 2019-03-03 DIAGNOSIS — R110 Nausea: Secondary | ICD-10-CM | POA: Diagnosis not present

## 2019-03-03 DIAGNOSIS — C9250 Acute myelomonocytic leukemia, not having achieved remission: Secondary | ICD-10-CM | POA: Diagnosis not present

## 2019-03-03 DIAGNOSIS — R5383 Other fatigue: Secondary | ICD-10-CM | POA: Diagnosis not present

## 2019-03-03 DIAGNOSIS — C9200 Acute myeloblastic leukemia, not having achieved remission: Secondary | ICD-10-CM | POA: Diagnosis not present

## 2019-03-03 DIAGNOSIS — R530 Neoplastic (malignant) related fatigue: Secondary | ICD-10-CM | POA: Diagnosis not present

## 2019-03-10 DIAGNOSIS — C9310 Chronic myelomonocytic leukemia not having achieved remission: Secondary | ICD-10-CM | POA: Diagnosis not present

## 2019-03-17 DIAGNOSIS — C9310 Chronic myelomonocytic leukemia not having achieved remission: Secondary | ICD-10-CM | POA: Diagnosis not present

## 2019-03-24 DIAGNOSIS — C9310 Chronic myelomonocytic leukemia not having achieved remission: Secondary | ICD-10-CM | POA: Diagnosis not present

## 2019-03-31 DIAGNOSIS — R530 Neoplastic (malignant) related fatigue: Secondary | ICD-10-CM | POA: Diagnosis not present

## 2019-03-31 DIAGNOSIS — Z79899 Other long term (current) drug therapy: Secondary | ICD-10-CM | POA: Diagnosis not present

## 2019-03-31 DIAGNOSIS — C9200 Acute myeloblastic leukemia, not having achieved remission: Secondary | ICD-10-CM | POA: Diagnosis not present

## 2019-03-31 DIAGNOSIS — C9250 Acute myelomonocytic leukemia, not having achieved remission: Secondary | ICD-10-CM | POA: Diagnosis not present

## 2019-03-31 DIAGNOSIS — G893 Neoplasm related pain (acute) (chronic): Secondary | ICD-10-CM | POA: Diagnosis not present

## 2019-03-31 DIAGNOSIS — C9310 Chronic myelomonocytic leukemia not having achieved remission: Secondary | ICD-10-CM | POA: Diagnosis not present

## 2019-03-31 DIAGNOSIS — Z79891 Long term (current) use of opiate analgesic: Secondary | ICD-10-CM | POA: Diagnosis not present

## 2019-04-07 DIAGNOSIS — C9310 Chronic myelomonocytic leukemia not having achieved remission: Secondary | ICD-10-CM | POA: Diagnosis not present

## 2019-04-08 ENCOUNTER — Telehealth: Payer: Self-pay | Admitting: Cardiovascular Disease

## 2019-04-08 NOTE — Telephone Encounter (Signed)
Recall - patient is not interested

## 2019-04-14 DIAGNOSIS — C9310 Chronic myelomonocytic leukemia not having achieved remission: Secondary | ICD-10-CM | POA: Diagnosis not present

## 2019-04-21 DIAGNOSIS — C9310 Chronic myelomonocytic leukemia not having achieved remission: Secondary | ICD-10-CM | POA: Diagnosis not present

## 2019-04-28 DIAGNOSIS — C9310 Chronic myelomonocytic leukemia not having achieved remission: Secondary | ICD-10-CM | POA: Diagnosis not present

## 2019-05-05 DIAGNOSIS — C9310 Chronic myelomonocytic leukemia not having achieved remission: Secondary | ICD-10-CM | POA: Diagnosis not present

## 2019-05-05 DIAGNOSIS — C884 Extranodal marginal zone B-cell lymphoma of mucosa-associated lymphoid tissue [MALT-lymphoma]: Secondary | ICD-10-CM | POA: Diagnosis not present

## 2019-05-05 DIAGNOSIS — R530 Neoplastic (malignant) related fatigue: Secondary | ICD-10-CM | POA: Diagnosis not present

## 2019-05-05 DIAGNOSIS — C9200 Acute myeloblastic leukemia, not having achieved remission: Secondary | ICD-10-CM | POA: Diagnosis not present

## 2019-05-13 DIAGNOSIS — C9310 Chronic myelomonocytic leukemia not having achieved remission: Secondary | ICD-10-CM | POA: Diagnosis not present

## 2019-05-19 DIAGNOSIS — C9310 Chronic myelomonocytic leukemia not having achieved remission: Secondary | ICD-10-CM | POA: Diagnosis not present

## 2019-05-26 DIAGNOSIS — C9310 Chronic myelomonocytic leukemia not having achieved remission: Secondary | ICD-10-CM | POA: Diagnosis not present

## 2019-05-29 DIAGNOSIS — K529 Noninfective gastroenteritis and colitis, unspecified: Secondary | ICD-10-CM | POA: Diagnosis not present

## 2019-05-29 DIAGNOSIS — R112 Nausea with vomiting, unspecified: Secondary | ICD-10-CM | POA: Diagnosis not present

## 2019-05-29 DIAGNOSIS — R509 Fever, unspecified: Secondary | ICD-10-CM | POA: Diagnosis not present

## 2019-05-29 DIAGNOSIS — R197 Diarrhea, unspecified: Secondary | ICD-10-CM | POA: Diagnosis not present

## 2019-05-30 ENCOUNTER — Other Ambulatory Visit: Payer: Self-pay

## 2019-05-30 ENCOUNTER — Telehealth: Payer: Self-pay | Admitting: Family Medicine

## 2019-05-30 DIAGNOSIS — R6889 Other general symptoms and signs: Secondary | ICD-10-CM | POA: Diagnosis not present

## 2019-05-30 DIAGNOSIS — Z20822 Contact with and (suspected) exposure to covid-19: Secondary | ICD-10-CM

## 2019-05-30 NOTE — Telephone Encounter (Signed)
Pt wife called and state that [facility] in [place] is suppose to call Monday and give you the results for the COVID test, for Jonathan Green, and after you get the results, she states you are suppose to call [place] with them he can be reached at [phone]

## 2019-05-31 LAB — NOVEL CORONAVIRUS, NAA: SARS-CoV-2, NAA: NOT DETECTED

## 2019-06-02 ENCOUNTER — Telehealth: Payer: Self-pay | Admitting: Family Medicine

## 2019-06-02 NOTE — Telephone Encounter (Signed)
pts wife called back, wanting to know if you had got the results from [place] test reults

## 2019-06-02 NOTE — Telephone Encounter (Signed)
Let her know that the test is negative

## 2019-06-02 NOTE — Telephone Encounter (Signed)
Patient wife is calling to receive the patient negative COVID test results. Wife Expressed understanding.

## 2019-06-02 NOTE — Telephone Encounter (Signed)
Pt was advised and routed results to Dr. Joan Mayans per pt request. [facility]

## 2019-06-03 DIAGNOSIS — C9310 Chronic myelomonocytic leukemia not having achieved remission: Secondary | ICD-10-CM | POA: Diagnosis not present

## 2019-06-06 ENCOUNTER — Ambulatory Visit (INDEPENDENT_AMBULATORY_CARE_PROVIDER_SITE_OTHER): Payer: Medicare HMO | Admitting: Family Medicine

## 2019-06-06 ENCOUNTER — Other Ambulatory Visit: Payer: Self-pay

## 2019-06-06 ENCOUNTER — Encounter: Payer: Self-pay | Admitting: Family Medicine

## 2019-06-06 VITALS — Temp 101.4°F | Wt 135.0 lb

## 2019-06-06 DIAGNOSIS — J209 Acute bronchitis, unspecified: Secondary | ICD-10-CM

## 2019-06-06 DIAGNOSIS — C9310 Chronic myelomonocytic leukemia not having achieved remission: Secondary | ICD-10-CM

## 2019-06-06 MED ORDER — AMOXICILLIN-POT CLAVULANATE 875-125 MG PO TABS: 1.0000 | ORAL_TABLET | Freq: Two times a day (BID) | ORAL | 0 refills | Status: AC

## 2019-06-06 NOTE — Progress Notes (Signed)
   Subjective:    Patient ID: Jonathan Green, male    DOB: February 06, 1941, 78 y.o.   MRN: [id number]  HPI Documentation for virtual telephone encounter. Interactive audio and video telecommunications were attempted between this provider and patient, however he did not have access to video capability.  We continued and completed visit with audio only. The patient was located at home. The provider was located in the office. The patient did consent to this visit and is aware of possible charges through their insurance for this visit.  The other persons participating in this telemedicine service was his wife Time spent on call was 5 minutes and in review of previous records >15 minutes total.  This virtual service is not related to other E/M service within previous 7 days. He states that on Tuesday he developed a cough, fever, chills, slight sore throat.  No earache, shortness of breath he was tested last week for COVID exposure and was negative on 2 occasions.  He recently had a blood transfusion as well as platelets.  He does have an underlying history of CMML and is being cared for at [facility].   Review of Systems     Objective:   Physical Exam Alert and no apparent distress.  His voice sounds normal.       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  Recommend over-the-counter cough suppressant.  Cautioned that if his symptoms worsen in terms of fever, chills, cough, shortness of breath etc., go to [facility] for further evaluation and treatment.  He and his wife understood this.

## 2019-06-09 DIAGNOSIS — D630 Anemia in neoplastic disease: Secondary | ICD-10-CM | POA: Diagnosis not present

## 2019-06-09 DIAGNOSIS — Z66 Do not resuscitate: Secondary | ICD-10-CM | POA: Diagnosis not present

## 2019-06-09 DIAGNOSIS — U071 COVID-19: Secondary | ICD-10-CM | POA: Diagnosis not present

## 2019-06-09 DIAGNOSIS — J189 Pneumonia, unspecified organism: Secondary | ICD-10-CM | POA: Diagnosis not present

## 2019-06-09 DIAGNOSIS — I493 Ventricular premature depolarization: Secondary | ICD-10-CM | POA: Diagnosis not present

## 2019-06-09 DIAGNOSIS — J9601 Acute respiratory failure with hypoxia: Secondary | ICD-10-CM | POA: Diagnosis not present

## 2019-06-09 DIAGNOSIS — R05 Cough: Secondary | ICD-10-CM | POA: Diagnosis not present

## 2019-06-09 DIAGNOSIS — E43 Unspecified severe protein-calorie malnutrition: Secondary | ICD-10-CM | POA: Diagnosis not present

## 2019-06-09 DIAGNOSIS — D696 Thrombocytopenia, unspecified: Secondary | ICD-10-CM | POA: Diagnosis not present

## 2019-06-09 DIAGNOSIS — D6959 Other secondary thrombocytopenia: Secondary | ICD-10-CM | POA: Diagnosis not present

## 2019-06-09 DIAGNOSIS — R918 Other nonspecific abnormal finding of lung field: Secondary | ICD-10-CM | POA: Diagnosis not present

## 2019-06-09 DIAGNOSIS — J1289 Other viral pneumonia: Secondary | ICD-10-CM | POA: Diagnosis not present

## 2019-06-09 DIAGNOSIS — C9200 Acute myeloblastic leukemia, not having achieved remission: Secondary | ICD-10-CM | POA: Diagnosis not present

## 2019-06-09 DIAGNOSIS — R42 Dizziness and giddiness: Secondary | ICD-10-CM | POA: Diagnosis not present

## 2019-06-09 DIAGNOSIS — R197 Diarrhea, unspecified: Secondary | ICD-10-CM | POA: Diagnosis not present

## 2019-06-09 DIAGNOSIS — D6489 Other specified anemias: Secondary | ICD-10-CM | POA: Diagnosis not present

## 2019-06-10 ENCOUNTER — Telehealth: Payer: Self-pay | Admitting: Family Medicine

## 2019-06-10 DIAGNOSIS — D6489 Other specified anemias: Secondary | ICD-10-CM | POA: Diagnosis not present

## 2019-06-10 DIAGNOSIS — C9200 Acute myeloblastic leukemia, not having achieved remission: Secondary | ICD-10-CM | POA: Diagnosis not present

## 2019-06-10 DIAGNOSIS — U071 COVID-19: Secondary | ICD-10-CM | POA: Diagnosis not present

## 2019-06-10 DIAGNOSIS — J9601 Acute respiratory failure with hypoxia: Secondary | ICD-10-CM | POA: Diagnosis not present

## 2019-06-10 DIAGNOSIS — J1289 Other viral pneumonia: Secondary | ICD-10-CM | POA: Diagnosis not present

## 2019-06-10 DIAGNOSIS — D696 Thrombocytopenia, unspecified: Secondary | ICD-10-CM | POA: Diagnosis not present

## 2019-06-10 NOTE — Telephone Encounter (Signed)
Pts wife called and wanted to let you know that pt is in the hospital with Covid at [facility] in [place].

## 2019-06-11 DIAGNOSIS — D696 Thrombocytopenia, unspecified: Secondary | ICD-10-CM | POA: Diagnosis not present

## 2019-06-11 DIAGNOSIS — U071 COVID-19: Secondary | ICD-10-CM | POA: Diagnosis not present

## 2019-06-11 DIAGNOSIS — J9601 Acute respiratory failure with hypoxia: Secondary | ICD-10-CM | POA: Diagnosis not present

## 2019-06-12 DIAGNOSIS — D6489 Other specified anemias: Secondary | ICD-10-CM | POA: Diagnosis not present

## 2019-06-12 DIAGNOSIS — J1289 Other viral pneumonia: Secondary | ICD-10-CM | POA: Diagnosis not present

## 2019-06-12 DIAGNOSIS — C9200 Acute myeloblastic leukemia, not having achieved remission: Secondary | ICD-10-CM | POA: Diagnosis not present

## 2019-06-12 DIAGNOSIS — U071 COVID-19: Secondary | ICD-10-CM | POA: Diagnosis not present

## 2019-06-12 DIAGNOSIS — J9601 Acute respiratory failure with hypoxia: Secondary | ICD-10-CM | POA: Diagnosis not present

## 2019-06-12 DIAGNOSIS — D696 Thrombocytopenia, unspecified: Secondary | ICD-10-CM | POA: Diagnosis not present

## 2019-06-13 DIAGNOSIS — U071 COVID-19: Secondary | ICD-10-CM | POA: Diagnosis not present

## 2019-06-13 DIAGNOSIS — D696 Thrombocytopenia, unspecified: Secondary | ICD-10-CM | POA: Diagnosis not present

## 2019-06-13 DIAGNOSIS — C9200 Acute myeloblastic leukemia, not having achieved remission: Secondary | ICD-10-CM | POA: Diagnosis not present

## 2019-06-13 DIAGNOSIS — D6489 Other specified anemias: Secondary | ICD-10-CM | POA: Diagnosis not present

## 2019-06-13 DIAGNOSIS — J1289 Other viral pneumonia: Secondary | ICD-10-CM | POA: Diagnosis not present

## 2019-06-13 DIAGNOSIS — J9601 Acute respiratory failure with hypoxia: Secondary | ICD-10-CM | POA: Diagnosis not present

## 2019-06-14 DIAGNOSIS — D6489 Other specified anemias: Secondary | ICD-10-CM | POA: Diagnosis not present

## 2019-06-14 DIAGNOSIS — C9200 Acute myeloblastic leukemia, not having achieved remission: Secondary | ICD-10-CM | POA: Diagnosis not present

## 2019-06-14 DIAGNOSIS — J1289 Other viral pneumonia: Secondary | ICD-10-CM | POA: Diagnosis not present

## 2019-06-14 DIAGNOSIS — U071 COVID-19: Secondary | ICD-10-CM | POA: Diagnosis not present

## 2019-06-14 DIAGNOSIS — J9601 Acute respiratory failure with hypoxia: Secondary | ICD-10-CM | POA: Diagnosis not present

## 2019-06-14 DIAGNOSIS — D696 Thrombocytopenia, unspecified: Secondary | ICD-10-CM | POA: Diagnosis not present

## 2019-06-15 DIAGNOSIS — D6489 Other specified anemias: Secondary | ICD-10-CM | POA: Diagnosis not present

## 2019-06-15 DIAGNOSIS — J1289 Other viral pneumonia: Secondary | ICD-10-CM | POA: Diagnosis not present

## 2019-06-15 DIAGNOSIS — C9200 Acute myeloblastic leukemia, not having achieved remission: Secondary | ICD-10-CM | POA: Diagnosis not present

## 2019-06-15 DIAGNOSIS — D696 Thrombocytopenia, unspecified: Secondary | ICD-10-CM | POA: Diagnosis not present

## 2019-06-15 DIAGNOSIS — U071 COVID-19: Secondary | ICD-10-CM | POA: Diagnosis not present

## 2019-06-15 DIAGNOSIS — J9601 Acute respiratory failure with hypoxia: Secondary | ICD-10-CM | POA: Diagnosis not present

## 2019-06-16 DIAGNOSIS — U071 COVID-19: Secondary | ICD-10-CM | POA: Diagnosis not present

## 2019-06-16 DIAGNOSIS — D696 Thrombocytopenia, unspecified: Secondary | ICD-10-CM | POA: Diagnosis not present

## 2019-06-16 DIAGNOSIS — D6489 Other specified anemias: Secondary | ICD-10-CM | POA: Diagnosis not present

## 2019-06-16 DIAGNOSIS — C9200 Acute myeloblastic leukemia, not having achieved remission: Secondary | ICD-10-CM | POA: Diagnosis not present

## 2019-06-16 DIAGNOSIS — J9601 Acute respiratory failure with hypoxia: Secondary | ICD-10-CM | POA: Diagnosis not present

## 2019-06-16 DIAGNOSIS — J1289 Other viral pneumonia: Secondary | ICD-10-CM | POA: Diagnosis not present

## 2019-06-19 DIAGNOSIS — U071 COVID-19: Secondary | ICD-10-CM | POA: Diagnosis not present

## 2019-06-19 DIAGNOSIS — J9601 Acute respiratory failure with hypoxia: Secondary | ICD-10-CM | POA: Diagnosis not present

## 2019-06-24 DIAGNOSIS — Z9181 History of falling: Secondary | ICD-10-CM | POA: Diagnosis not present

## 2019-06-24 DIAGNOSIS — Z9981 Dependence on supplemental oxygen: Secondary | ICD-10-CM | POA: Diagnosis not present

## 2019-06-24 DIAGNOSIS — J1289 Other viral pneumonia: Secondary | ICD-10-CM | POA: Diagnosis not present

## 2019-06-24 DIAGNOSIS — U071 COVID-19: Secondary | ICD-10-CM | POA: Diagnosis not present

## 2019-06-24 DIAGNOSIS — J208 Acute bronchitis due to other specified organisms: Secondary | ICD-10-CM | POA: Diagnosis not present

## 2019-06-24 DIAGNOSIS — C884 Extranodal marginal zone B-cell lymphoma of mucosa-associated lymphoid tissue [MALT-lymphoma]: Secondary | ICD-10-CM | POA: Diagnosis not present

## 2019-06-24 DIAGNOSIS — Z87891 Personal history of nicotine dependence: Secondary | ICD-10-CM | POA: Diagnosis not present

## 2019-06-24 DIAGNOSIS — Z86010 Personal history of colonic polyps: Secondary | ICD-10-CM | POA: Diagnosis not present

## 2019-06-24 DIAGNOSIS — D630 Anemia in neoplastic disease: Secondary | ICD-10-CM | POA: Diagnosis not present

## 2019-06-26 DIAGNOSIS — C9200 Acute myeloblastic leukemia, not having achieved remission: Secondary | ICD-10-CM | POA: Diagnosis not present

## 2019-06-26 DIAGNOSIS — R112 Nausea with vomiting, unspecified: Secondary | ICD-10-CM | POA: Diagnosis not present

## 2019-06-26 DIAGNOSIS — Z79899 Other long term (current) drug therapy: Secondary | ICD-10-CM | POA: Diagnosis not present

## 2019-06-26 DIAGNOSIS — C9310 Chronic myelomonocytic leukemia not having achieved remission: Secondary | ICD-10-CM | POA: Diagnosis not present

## 2019-06-26 DIAGNOSIS — D649 Anemia, unspecified: Secondary | ICD-10-CM | POA: Diagnosis not present

## 2019-06-30 ENCOUNTER — Telehealth: Payer: Self-pay | Admitting: Internal Medicine

## 2019-06-30 DIAGNOSIS — C9310 Chronic myelomonocytic leukemia not having achieved remission: Secondary | ICD-10-CM | POA: Diagnosis not present

## 2019-06-30 NOTE — Telephone Encounter (Signed)
Melissa as advised [facility]

## 2019-06-30 NOTE — Telephone Encounter (Signed)
Pt was in the hospital recently for acute respiratory failure and [facility] is wanting to know if PT can be done on him once a week x 4 weeks.  Call Rosalia Hammers at [facility] back for verbal order

## 2019-06-30 NOTE — Telephone Encounter (Signed)
Ok

## 2019-07-02 DIAGNOSIS — Z86010 Personal history of colonic polyps: Secondary | ICD-10-CM | POA: Diagnosis not present

## 2019-07-02 DIAGNOSIS — U071 COVID-19: Secondary | ICD-10-CM | POA: Diagnosis not present

## 2019-07-02 DIAGNOSIS — J1289 Other viral pneumonia: Secondary | ICD-10-CM | POA: Diagnosis not present

## 2019-07-02 DIAGNOSIS — Z9981 Dependence on supplemental oxygen: Secondary | ICD-10-CM | POA: Diagnosis not present

## 2019-07-02 DIAGNOSIS — C884 Extranodal marginal zone B-cell lymphoma of mucosa-associated lymphoid tissue [MALT-lymphoma]: Secondary | ICD-10-CM | POA: Diagnosis not present

## 2019-07-02 DIAGNOSIS — Z9181 History of falling: Secondary | ICD-10-CM | POA: Diagnosis not present

## 2019-07-02 DIAGNOSIS — D630 Anemia in neoplastic disease: Secondary | ICD-10-CM | POA: Diagnosis not present

## 2019-07-02 DIAGNOSIS — J208 Acute bronchitis due to other specified organisms: Secondary | ICD-10-CM | POA: Diagnosis not present

## 2019-07-02 DIAGNOSIS — Z87891 Personal history of nicotine dependence: Secondary | ICD-10-CM | POA: Diagnosis not present

## 2019-07-07 DIAGNOSIS — C9200 Acute myeloblastic leukemia, not having achieved remission: Secondary | ICD-10-CM | POA: Diagnosis not present

## 2019-07-07 DIAGNOSIS — R140 Abdominal distension (gaseous): Secondary | ICD-10-CM | POA: Diagnosis not present

## 2019-07-07 DIAGNOSIS — C9310 Chronic myelomonocytic leukemia not having achieved remission: Secondary | ICD-10-CM | POA: Diagnosis not present

## 2019-07-11 DIAGNOSIS — R0602 Shortness of breath: Secondary | ICD-10-CM | POA: Diagnosis not present

## 2019-07-11 DIAGNOSIS — R109 Unspecified abdominal pain: Secondary | ICD-10-CM | POA: Diagnosis not present

## 2019-07-11 DIAGNOSIS — D696 Thrombocytopenia, unspecified: Secondary | ICD-10-CM | POA: Diagnosis not present

## 2019-07-11 DIAGNOSIS — J9611 Chronic respiratory failure with hypoxia: Secondary | ICD-10-CM | POA: Diagnosis not present

## 2019-07-11 DIAGNOSIS — C9310 Chronic myelomonocytic leukemia not having achieved remission: Secondary | ICD-10-CM | POA: Diagnosis not present

## 2019-07-11 DIAGNOSIS — C9200 Acute myeloblastic leukemia, not having achieved remission: Secondary | ICD-10-CM | POA: Diagnosis not present

## 2019-07-11 DIAGNOSIS — R590 Localized enlarged lymph nodes: Secondary | ICD-10-CM | POA: Diagnosis not present

## 2019-07-11 DIAGNOSIS — J9601 Acute respiratory failure with hypoxia: Secondary | ICD-10-CM | POA: Diagnosis not present

## 2019-07-11 DIAGNOSIS — E43 Unspecified severe protein-calorie malnutrition: Secondary | ICD-10-CM | POA: Diagnosis not present

## 2019-07-11 DIAGNOSIS — J8410 Pulmonary fibrosis, unspecified: Secondary | ICD-10-CM | POA: Diagnosis not present

## 2019-07-11 DIAGNOSIS — R1012 Left upper quadrant pain: Secondary | ICD-10-CM | POA: Diagnosis not present

## 2019-07-11 DIAGNOSIS — R197 Diarrhea, unspecified: Secondary | ICD-10-CM | POA: Diagnosis not present

## 2019-07-11 DIAGNOSIS — R112 Nausea with vomiting, unspecified: Secondary | ICD-10-CM | POA: Diagnosis not present

## 2019-07-11 DIAGNOSIS — R1110 Vomiting, unspecified: Secondary | ICD-10-CM | POA: Diagnosis not present

## 2019-07-11 DIAGNOSIS — R161 Splenomegaly, not elsewhere classified: Secondary | ICD-10-CM | POA: Diagnosis not present

## 2019-07-11 DIAGNOSIS — R918 Other nonspecific abnormal finding of lung field: Secondary | ICD-10-CM | POA: Diagnosis not present

## 2019-07-11 DIAGNOSIS — J982 Interstitial emphysema: Secondary | ICD-10-CM | POA: Diagnosis not present

## 2019-07-11 DIAGNOSIS — Z8619 Personal history of other infectious and parasitic diseases: Secondary | ICD-10-CM | POA: Diagnosis not present

## 2019-07-11 DIAGNOSIS — Z66 Do not resuscitate: Secondary | ICD-10-CM | POA: Diagnosis not present

## 2019-07-11 DIAGNOSIS — R079 Chest pain, unspecified: Secondary | ICD-10-CM | POA: Diagnosis not present

## 2019-07-11 DIAGNOSIS — R1032 Left lower quadrant pain: Secondary | ICD-10-CM | POA: Diagnosis not present

## 2019-07-11 DIAGNOSIS — Z681 Body mass index (BMI) 19 or less, adult: Secondary | ICD-10-CM | POA: Diagnosis not present

## 2019-07-11 DIAGNOSIS — C884 Extranodal marginal zone B-cell lymphoma of mucosa-associated lymphoid tissue [MALT-lymphoma]: Secondary | ICD-10-CM | POA: Diagnosis not present

## 2019-07-12 DIAGNOSIS — R918 Other nonspecific abnormal finding of lung field: Secondary | ICD-10-CM | POA: Diagnosis not present

## 2019-07-12 DIAGNOSIS — J982 Interstitial emphysema: Secondary | ICD-10-CM | POA: Diagnosis not present

## 2019-07-12 DIAGNOSIS — R161 Splenomegaly, not elsewhere classified: Secondary | ICD-10-CM | POA: Diagnosis not present

## 2019-07-12 DIAGNOSIS — J8410 Pulmonary fibrosis, unspecified: Secondary | ICD-10-CM | POA: Diagnosis not present

## 2019-07-12 DIAGNOSIS — Z8619 Personal history of other infectious and parasitic diseases: Secondary | ICD-10-CM | POA: Diagnosis not present

## 2019-07-12 DIAGNOSIS — R590 Localized enlarged lymph nodes: Secondary | ICD-10-CM | POA: Diagnosis not present

## 2019-07-12 DIAGNOSIS — J9601 Acute respiratory failure with hypoxia: Secondary | ICD-10-CM | POA: Diagnosis not present

## 2019-07-13 DIAGNOSIS — J982 Interstitial emphysema: Secondary | ICD-10-CM | POA: Diagnosis not present

## 2019-07-19 MED ORDER — LORAZEPAM 0.5 MG PO TABS
0.50 | ORAL_TABLET | ORAL | Status: DC
Start: ? — End: 2019-07-19

## 2019-07-19 MED ORDER — POLYETHYLENE GLYCOL 3350 17 G PO PACK
17.00 | PACK | ORAL | Status: DC
Start: ? — End: 2019-07-19

## 2019-07-19 MED ORDER — PROMETHAZINE HCL 12.5 MG PO TABS
12.50 | ORAL_TABLET | ORAL | Status: DC
Start: ? — End: 2019-07-19

## 2019-07-19 MED ORDER — ALLOPURINOL 300 MG PO TABS
300.00 | ORAL_TABLET | ORAL | Status: DC
Start: 2019-07-20 — End: 2019-07-19

## 2019-07-19 MED ORDER — GENERIC EXTERNAL MEDICATION
625.00 | Status: DC
Start: 2019-07-19 — End: 2019-07-19

## 2019-07-19 MED ORDER — TRAMADOL HCL 50 MG PO TABS
50.00 | ORAL_TABLET | ORAL | Status: DC
Start: ? — End: 2019-07-19

## 2019-07-19 MED ORDER — GENERIC EXTERNAL MEDICATION
12.50 | Status: DC
Start: ? — End: 2019-07-19

## 2019-07-20 DIAGNOSIS — J9601 Acute respiratory failure with hypoxia: Secondary | ICD-10-CM | POA: Diagnosis not present

## 2019-07-20 DIAGNOSIS — U071 COVID-19: Secondary | ICD-10-CM | POA: Diagnosis not present

## 2019-07-21 DIAGNOSIS — C9310 Chronic myelomonocytic leukemia not having achieved remission: Secondary | ICD-10-CM | POA: Diagnosis not present

## 2019-07-21 DIAGNOSIS — C9200 Acute myeloblastic leukemia, not having achieved remission: Secondary | ICD-10-CM | POA: Diagnosis not present

## 2019-07-21 DIAGNOSIS — K921 Melena: Secondary | ICD-10-CM | POA: Diagnosis not present

## 2019-07-21 DIAGNOSIS — R112 Nausea with vomiting, unspecified: Secondary | ICD-10-CM | POA: Diagnosis not present

## 2019-07-21 DIAGNOSIS — R197 Diarrhea, unspecified: Secondary | ICD-10-CM | POA: Diagnosis not present

## 2019-07-21 DIAGNOSIS — Z79899 Other long term (current) drug therapy: Secondary | ICD-10-CM | POA: Diagnosis not present

## 2019-07-21 DIAGNOSIS — R1013 Epigastric pain: Secondary | ICD-10-CM | POA: Diagnosis not present

## 2019-07-25 DIAGNOSIS — C9310 Chronic myelomonocytic leukemia not having achieved remission: Secondary | ICD-10-CM | POA: Diagnosis not present

## 2019-07-28 DIAGNOSIS — C9310 Chronic myelomonocytic leukemia not having achieved remission: Secondary | ICD-10-CM | POA: Diagnosis not present

## 2019-07-28 DIAGNOSIS — C9200 Acute myeloblastic leukemia, not having achieved remission: Secondary | ICD-10-CM | POA: Diagnosis not present

## 2019-08-01 DIAGNOSIS — C9200 Acute myeloblastic leukemia, not having achieved remission: Secondary | ICD-10-CM | POA: Diagnosis not present

## 2019-08-01 DIAGNOSIS — C9310 Chronic myelomonocytic leukemia not having achieved remission: Secondary | ICD-10-CM | POA: Diagnosis not present

## 2019-08-04 DIAGNOSIS — C9200 Acute myeloblastic leukemia, not having achieved remission: Secondary | ICD-10-CM | POA: Diagnosis not present

## 2019-08-04 DIAGNOSIS — C9310 Chronic myelomonocytic leukemia not having achieved remission: Secondary | ICD-10-CM | POA: Diagnosis not present

## 2019-08-04 DIAGNOSIS — R0902 Hypoxemia: Secondary | ICD-10-CM | POA: Diagnosis not present

## 2019-08-04 DIAGNOSIS — R112 Nausea with vomiting, unspecified: Secondary | ICD-10-CM | POA: Diagnosis not present

## 2019-08-04 DIAGNOSIS — Z79891 Long term (current) use of opiate analgesic: Secondary | ICD-10-CM | POA: Diagnosis not present

## 2019-08-04 DIAGNOSIS — K5900 Constipation, unspecified: Secondary | ICD-10-CM | POA: Diagnosis not present

## 2019-08-04 DIAGNOSIS — M898X9 Other specified disorders of bone, unspecified site: Secondary | ICD-10-CM | POA: Diagnosis not present

## 2019-08-04 DIAGNOSIS — J982 Interstitial emphysema: Secondary | ICD-10-CM | POA: Diagnosis not present

## 2019-08-04 DIAGNOSIS — R1012 Left upper quadrant pain: Secondary | ICD-10-CM | POA: Diagnosis not present

## 2019-08-04 DIAGNOSIS — Z79899 Other long term (current) drug therapy: Secondary | ICD-10-CM | POA: Diagnosis not present

## 2019-08-04 DIAGNOSIS — G893 Neoplasm related pain (acute) (chronic): Secondary | ICD-10-CM | POA: Diagnosis not present

## 2019-08-11 DIAGNOSIS — C9310 Chronic myelomonocytic leukemia not having achieved remission: Secondary | ICD-10-CM | POA: Diagnosis not present

## 2019-08-18 DIAGNOSIS — Z79899 Other long term (current) drug therapy: Secondary | ICD-10-CM | POA: Diagnosis not present

## 2019-08-18 DIAGNOSIS — R112 Nausea with vomiting, unspecified: Secondary | ICD-10-CM | POA: Diagnosis not present

## 2019-08-18 DIAGNOSIS — G893 Neoplasm related pain (acute) (chronic): Secondary | ICD-10-CM | POA: Diagnosis not present

## 2019-08-18 DIAGNOSIS — C9200 Acute myeloblastic leukemia, not having achieved remission: Secondary | ICD-10-CM | POA: Diagnosis not present

## 2019-08-18 DIAGNOSIS — C9310 Chronic myelomonocytic leukemia not having achieved remission: Secondary | ICD-10-CM | POA: Diagnosis not present

## 2019-08-18 DIAGNOSIS — Z79891 Long term (current) use of opiate analgesic: Secondary | ICD-10-CM | POA: Diagnosis not present

## 2019-08-25 DIAGNOSIS — C9310 Chronic myelomonocytic leukemia not having achieved remission: Secondary | ICD-10-CM | POA: Diagnosis not present

## 2019-09-01 DIAGNOSIS — J982 Interstitial emphysema: Secondary | ICD-10-CM | POA: Diagnosis not present

## 2019-09-01 DIAGNOSIS — C9310 Chronic myelomonocytic leukemia not having achieved remission: Secondary | ICD-10-CM | POA: Diagnosis not present

## 2019-09-01 DIAGNOSIS — C9200 Acute myeloblastic leukemia, not having achieved remission: Secondary | ICD-10-CM | POA: Diagnosis not present

## 2019-09-01 DIAGNOSIS — K5900 Constipation, unspecified: Secondary | ICD-10-CM | POA: Diagnosis not present

## 2019-09-01 DIAGNOSIS — R112 Nausea with vomiting, unspecified: Secondary | ICD-10-CM | POA: Diagnosis not present

## 2019-09-01 DIAGNOSIS — G893 Neoplasm related pain (acute) (chronic): Secondary | ICD-10-CM | POA: Diagnosis not present

## 2019-09-01 DIAGNOSIS — Z79899 Other long term (current) drug therapy: Secondary | ICD-10-CM | POA: Diagnosis not present

## 2019-09-08 DIAGNOSIS — C9310 Chronic myelomonocytic leukemia not having achieved remission: Secondary | ICD-10-CM | POA: Diagnosis not present

## 2019-09-15 DIAGNOSIS — D696 Thrombocytopenia, unspecified: Secondary | ICD-10-CM | POA: Diagnosis not present

## 2019-09-15 DIAGNOSIS — C9200 Acute myeloblastic leukemia, not having achieved remission: Secondary | ICD-10-CM | POA: Diagnosis not present

## 2019-09-15 DIAGNOSIS — R112 Nausea with vomiting, unspecified: Secondary | ICD-10-CM | POA: Diagnosis not present

## 2019-09-15 DIAGNOSIS — C9310 Chronic myelomonocytic leukemia not having achieved remission: Secondary | ICD-10-CM | POA: Diagnosis not present

## 2019-10-02 ENCOUNTER — Telehealth: Payer: Self-pay | Admitting: Family Medicine

## 2019-10-02 NOTE — Telephone Encounter (Signed)
Pts wife called and wanted to let you know pt died yesterday. He has been battling leukemia for awhile.

## 2019-10-06 DEATH — deceased
